# Patient Record
Sex: Male | Born: 1970 | Race: Black or African American | Hispanic: No | Marital: Married | Smoking: Never smoker
Health system: Southern US, Community
[De-identification: ages and names within clinical notes are randomized; demographics above are authoritative.]

## PROBLEM LIST (undated history)

## (undated) DIAGNOSIS — Z87442 Personal history of urinary calculi: Secondary | ICD-10-CM

## (undated) DIAGNOSIS — C61 Malignant neoplasm of prostate: Secondary | ICD-10-CM

## (undated) DIAGNOSIS — N189 Chronic kidney disease, unspecified: Secondary | ICD-10-CM

## (undated) HISTORY — PX: NO PAST SURGERIES: SHX2092

---

## 2003-09-28 ENCOUNTER — Emergency Department (HOSPITAL_COMMUNITY): Admission: EM | Admit: 2003-09-28 | Discharge: 2003-09-28 | Payer: Self-pay | Admitting: Emergency Medicine

## 2015-05-16 ENCOUNTER — Emergency Department (HOSPITAL_COMMUNITY)
Admission: EM | Admit: 2015-05-16 | Discharge: 2015-05-16 | Disposition: A | Discharge status: Home / Self Care | Payer: Managed Care, Other (non HMO) | Attending: Emergency Medicine | Admitting: Emergency Medicine

## 2015-05-16 ENCOUNTER — Emergency Department (HOSPITAL_COMMUNITY): Payer: Managed Care, Other (non HMO)

## 2015-05-16 ENCOUNTER — Encounter (HOSPITAL_COMMUNITY): Payer: Self-pay | Admitting: Emergency Medicine

## 2015-05-16 DIAGNOSIS — R109 Unspecified abdominal pain: Secondary | ICD-10-CM

## 2015-05-16 DIAGNOSIS — N200 Calculus of kidney: Secondary | ICD-10-CM

## 2015-05-16 DIAGNOSIS — R1031 Right lower quadrant pain: Secondary | ICD-10-CM | POA: Diagnosis present

## 2015-05-16 LAB — I-STAT CHEM 8, ED
BUN: 19 mg/dL (ref 6–20)
CHLORIDE: 104 mmol/L (ref 101–111)
Calcium, Ion: 1.21 mmol/L (ref 1.12–1.23)
Creatinine, Ser: 1.5 mg/dL — ABNORMAL HIGH (ref 0.61–1.24)
Glucose, Bld: 108 mg/dL — ABNORMAL HIGH (ref 65–99)
HEMATOCRIT: 46 % (ref 39.0–52.0)
Hemoglobin: 15.6 g/dL (ref 13.0–17.0)
POTASSIUM: 4 mmol/L (ref 3.5–5.1)
SODIUM: 141 mmol/L (ref 135–145)
TCO2: 22 mmol/L (ref 0–100)

## 2015-05-16 LAB — CBC WITH DIFFERENTIAL/PLATELET
BASOS PCT: 0 %
Basophils Absolute: 0 10*3/uL (ref 0.0–0.1)
EOS ABS: 0 10*3/uL (ref 0.0–0.7)
Eosinophils Relative: 0 %
HCT: 41.1 % (ref 39.0–52.0)
HEMOGLOBIN: 14.6 g/dL (ref 13.0–17.0)
LYMPHS ABS: 0.8 10*3/uL (ref 0.7–4.0)
Lymphocytes Relative: 9 %
MCH: 30.7 pg (ref 26.0–34.0)
MCHC: 35.5 g/dL (ref 30.0–36.0)
MCV: 86.5 fL (ref 78.0–100.0)
Monocytes Absolute: 0.4 10*3/uL (ref 0.1–1.0)
Monocytes Relative: 4 %
NEUTROS PCT: 87 %
Neutro Abs: 7.9 10*3/uL — ABNORMAL HIGH (ref 1.7–7.7)
Platelets: 171 10*3/uL (ref 150–400)
RBC: 4.75 MIL/uL (ref 4.22–5.81)
RDW: 13.3 % (ref 11.5–15.5)
WBC: 9.1 10*3/uL (ref 4.0–10.5)

## 2015-05-16 LAB — URINALYSIS, ROUTINE W REFLEX MICROSCOPIC
BILIRUBIN URINE: NEGATIVE
Glucose, UA: NEGATIVE mg/dL
Ketones, ur: NEGATIVE mg/dL
Leukocytes, UA: NEGATIVE
NITRITE: NEGATIVE
Protein, ur: NEGATIVE mg/dL
SPECIFIC GRAVITY, URINE: 1.023 (ref 1.005–1.030)
pH: 7 (ref 5.0–8.0)

## 2015-05-16 LAB — URINE MICROSCOPIC-ADD ON: Squamous Epithelial / LPF: NONE SEEN

## 2015-05-16 MED ORDER — KETOROLAC TROMETHAMINE 30 MG/ML IJ SOLN
30.0000 mg | Freq: Once | INTRAMUSCULAR | Status: AC
  Administered 2015-05-16: 30 mg via INTRAVENOUS
  Filled 2015-05-16: qty 1

## 2015-05-16 MED ORDER — IBUPROFEN 600 MG PO TABS: 600.0000 mg | ORAL_TABLET | Freq: Four times a day (QID) | ORAL | Status: DC | PRN

## 2015-05-16 MED ORDER — ONDANSETRON 8 MG PO TBDP: 8.0000 mg | ORAL_TABLET | Freq: Three times a day (TID) | ORAL | Status: DC | PRN

## 2015-05-16 MED ORDER — MORPHINE SULFATE (PF) 4 MG/ML IV SOLN
4.0000 mg | Freq: Once | INTRAVENOUS | Status: AC
  Administered 2015-05-16: 4 mg via INTRAVENOUS
  Filled 2015-05-16: qty 1

## 2015-05-16 MED ORDER — ONDANSETRON HCL 4 MG/2ML IJ SOLN
4.0000 mg | Freq: Once | INTRAMUSCULAR | Status: AC
  Administered 2015-05-16: 4 mg via INTRAVENOUS
  Filled 2015-05-16: qty 2

## 2015-05-16 MED ORDER — TAMSULOSIN HCL 0.4 MG PO CAPS: 0.4000 mg | ORAL_CAPSULE | Freq: Every day | ORAL | Status: DC

## 2015-05-16 MED ORDER — OXYCODONE-ACETAMINOPHEN 5-325 MG PO TABS: 1.0000 | ORAL_TABLET | ORAL | Status: DC | PRN

## 2015-05-16 NOTE — ED Notes (Signed)
Pt reports R flank for the past 2 days with radiation into groin with urination. Pt went to [facility] who completed an [place] which showed renal swelling. Pt waiting to hear from a urologist to schedule an appointment, but has not gotten return phone call yet. Pain has become worse so he came here for evaluation.

## 2015-05-16 NOTE — ED Provider Notes (Signed)
CSN: [id number]     Arrival date & time 05/16/15  1226 History   First MD Initiated Contact with Patient 05/16/15 1238     Chief Complaint  Patient presents with  . Flank Pain     (Consider location/radiation/quality/duration/timing/severity/associated sxs/prior Treatment) HPI Blake Maxwell is a 44 y.o. male with no medical problems, presents to emergency department complaining of right flank pain. Patient states severe pain in the right flank started 2 days ago. It radiates into the right groin. He reports similar pain one month ago which resolved and again one week ago. He seems to think that this severe pain is still the same pain from a week ago. He went to [facility] where he had an ultrasound which showed "swelling around the kidney. "He states that they told him they did not think this was a kidney stone and told him he needed to follow-up with urologist. Patient states they were supposed to set up urology follow-up but he has not heard from them. He states pain has been worsening, he reports nausea and vomiting this morning. He states pain comes and goes in severity. She reports blood in his urine. He denies any fever or chills. He denies any abdominal tenderness. No history of bladder infections. He is currently not taking any medications for his pain.  History reviewed. No pertinent past medical history. History reviewed. No pertinent past surgical history. History reviewed. No pertinent family history. Social History  Substance Use Topics  . Smoking status: Never Smoker   . Smokeless tobacco: None  . Alcohol Use: No    Review of Systems  Constitutional: Negative for fever and chills.  Respiratory: Negative for cough, chest tightness and shortness of breath.   Cardiovascular: Negative for chest pain, palpitations and leg swelling.  Gastrointestinal: Positive for nausea, vomiting and abdominal pain. Negative for diarrhea and abdominal distention.  Genitourinary:  Positive for dysuria, frequency, hematuria and flank pain. Negative for urgency.  Musculoskeletal: Negative for myalgias, arthralgias, neck pain and neck stiffness.  Skin: Negative for rash.  Allergic/Immunologic: Negative for immunocompromised state.  Neurological: Negative for dizziness, weakness, light-headedness, numbness and headaches.  All other systems reviewed and are negative.     Allergies  Review of patient's allergies indicates no known allergies.  Home Medications   Prior to Admission medications   Not on File   BP 128/83 mmHg  Pulse 98  Temp(Src) 98.2 F (36.8 C) (Oral)  SpO2 100% Physical Exam  Constitutional: He appears well-developed and well-nourished. No distress.  HENT:  Head: Normocephalic and atraumatic.  Eyes: Conjunctivae are normal.  Neck: Neck supple.  Cardiovascular: Normal rate, regular rhythm and normal heart sounds.   Pulmonary/Chest: Effort normal. No respiratory distress. He has no wheezes. He has no rales.  Abdominal: Soft. Bowel sounds are normal. He exhibits no distension. There is no tenderness. There is no rebound and no guarding.  Right CVA tenderness  Musculoskeletal: He exhibits no edema.  Neurological: He is alert.  Skin: Skin is warm and dry.  Nursing note and vitals reviewed.   ED Course  Procedures (including critical care time) Labs Review Labs Reviewed  URINALYSIS, ROUTINE W REFLEX MICROSCOPIC (NOT AT [facility]) - Abnormal; Notable for the following:    APPearance CLOUDY (*)    Hgb urine dipstick MODERATE (*)    All other components within normal limits  CBC WITH DIFFERENTIAL/PLATELET - Abnormal; Notable for the following:    Neutro Abs 7.9 (*)    All other components  within normal limits  URINE MICROSCOPIC-ADD ON - Abnormal; Notable for the following:    Bacteria, UA RARE (*)    All other components within normal limits  I-STAT CHEM 8, ED - Abnormal; Notable for the following:    Creatinine, Ser 1.50 (*)    Glucose, Bld  108 (*)    All other components within normal limits    Imaging Review Ct Renal Stone Study  05/16/2015  CLINICAL DATA:  Acute right flank pain.  Gross hematuria. EXAM: CT ABDOMEN AND PELVIS WITHOUT CONTRAST TECHNIQUE: Multidetector CT imaging of the abdomen and pelvis was performed following the standard protocol without IV contrast. COMPARISON:  None. FINDINGS: Visualized lung bases are unremarkable. No significant osseous abnormality is noted. No gallstones are noted. No focal abnormality is noted in the liver, spleen or pancreas on these unenhanced images. Adrenal glands appear normal. Small nonobstructive calculi are noted in lower pole collecting system of left kidney. Moderate right hydroureteronephrosis with perinephric stranding is noted secondary to 8 mm calculus in distal right ureter. Urinary bladder appears normal. Mild prostatic enlargement is noted. The appendix appears normal. There is no evidence of bowel obstruction. No abnormal fluid collection is noted. No significant adenopathy is noted. IMPRESSION: Nonobstructive left nephrolithiasis. Moderate right hydroureteronephrosis with perinephric stranding secondary to 8 mm distal right ureteral calculus. Electronically Signed   By: Marijo Conception, M.D.   On: 05/16/2015 14:25   I have personally reviewed and evaluated these images and lab results as part of my medical decision-making.   EKG Interpretation None      MDM   Final diagnoses:  Right flank pain  Kidney stone    Patient with a right flank pain radiating into the right groin with urinary symptoms, hematuria. No fever. Vital signs are normal. His symptoms are concerning for a kidney stone. Will get CT renal studies, urinalysis, CBC and chem 8. Toradol and morphine ordered for pain.  2:55 PM 11mm stone in distal ureter on the right. Pt's pain is improved with toradol and morphine. He has no nausea or vomiting. Afebrile. ua with no signs of infection. Creatinine at 1.5  Discussed with Dr. Diona Fanti, advised to call the office for close follow up apt. Pt agrees. Home with ibuprofen, percocet, zofran, flomax.  Filed Vitals:   05/16/15 1233  BP: 128/83  Pulse: 98  Temp: 98.2 F (36.8 C)  TempSrc: Oral  SpO2: 100%     Jeannett Senior, PA-C 05/16/15 [place], MD 05/19/15 [phone]

## 2015-05-16 NOTE — Discharge Instructions (Signed)
Ibuprofen for pain. Percocet for severe pain. zofran for nausea as needed. flomax to help you pass the stone. Follow up with urology as soon as able. Return if fever, uncontrolled pain, persistent vomiting.   Kidney Stones Kidney stones (urolithiasis) are deposits that form inside your kidneys. The intense pain is caused by the stone moving through the urinary tract. When the stone moves, the ureter goes into spasm around the stone. The stone is usually passed in the urine.  CAUSES   A disorder that makes certain neck glands produce too much parathyroid hormone (primary hyperparathyroidism).  A buildup of uric acid crystals, similar to gout in your joints.  Narrowing (stricture) of the ureter.  A kidney obstruction present at birth (congenital obstruction).  Previous surgery on the kidney or ureters.  Numerous kidney infections. SYMPTOMS   Feeling sick to your stomach (nauseous).  Throwing up (vomiting).  Blood in the urine (hematuria).  Pain that usually spreads (radiates) to the groin.  Frequency or urgency of urination. DIAGNOSIS   Taking a history and physical exam.  Blood or urine tests.  CT scan.  Occasionally, an examination of the inside of the urinary bladder (cystoscopy) is performed. TREATMENT   Observation.  Increasing your fluid intake.  Extracorporeal shock wave lithotripsy--This is a noninvasive procedure that uses shock waves to break up kidney stones.  Surgery may be needed if you have severe pain or persistent obstruction. There are various surgical procedures. Most of the procedures are performed with the use of small instruments. Only small incisions are needed to accommodate these instruments, so recovery time is minimized. The size, location, and chemical composition are all important variables that will determine the proper choice of action for you. Talk to your health care provider to better understand your situation so that you will minimize the  risk of injury to yourself and your kidney.  HOME CARE INSTRUCTIONS   Drink enough water and fluids to keep your urine clear or pale yellow. This will help you to pass the stone or stone fragments.  Strain all urine through the provided strainer. Keep all particulate matter and stones for your health care provider to see. The stone causing the pain may be as small as a grain of salt. It is very important to use the strainer each and every time you pass your urine. The collection of your stone will allow your health care provider to analyze it and verify that a stone has actually passed. The stone analysis will often identify what you can do to reduce the incidence of recurrences.  Only take over-the-counter or prescription medicines for pain, discomfort, or fever as directed by your health care provider.  Keep all follow-up visits as told by your health care provider. This is important.  Get follow-up X-rays if required. The absence of pain does not always mean that the stone has passed. It may have only stopped moving. If the urine remains completely obstructed, it can cause loss of kidney function or even complete destruction of the kidney. It is your responsibility to make sure X-rays and follow-ups are completed. Ultrasounds of the kidney can show blockages and the status of the kidney. Ultrasounds are not associated with any radiation and can be performed easily in a matter of minutes.  Make changes to your daily diet as told by your health care provider. You may be told to:  Limit the amount of salt that you eat.  Eat 5 or more servings of fruits and vegetables each  day.  Limit the amount of meat, poultry, fish, and eggs that you eat.  Collect a 24-hour urine sample as told by your health care provider.You may need to collect another urine sample every 6-12 months. SEEK MEDICAL CARE IF:  You experience pain that is progressive and unresponsive to any pain medicine you have been  prescribed. SEEK IMMEDIATE MEDICAL CARE IF:   Pain cannot be controlled with the prescribed medicine.  You have a fever or shaking chills.  The severity or intensity of pain increases over 18 hours and is not relieved by pain medicine.  You develop a new onset of abdominal pain.  You feel faint or pass out.  You are unable to urinate.   This information is not intended to replace advice given to you by your health care provider. Make sure you discuss any questions you have with your health care provider.   Document Released: 05/31/2005 Document Revised: 02/19/2015 Document Reviewed: 11/01/2012 Elsevier Interactive Patient Education 2016 [place] Guidelines to Help Prevent Kidney Stones Your risk of kidney stones can be decreased by adjusting the foods you eat. The most important thing you can do is drink enough fluid. You should drink enough fluid to keep your urine clear or pale yellow. The following guidelines provide specific information for the type of kidney stone you have had. GUIDELINES ACCORDING TO TYPE OF KIDNEY STONE Calcium Oxalate Kidney Stones  Reduce the amount of salt you eat. Foods that have a lot of salt cause your body to release excess calcium into your urine. The excess calcium can combine with a substance called oxalate to form kidney stones.  Reduce the amount of animal protein you eat if the amount you eat is excessive. Animal protein causes your body to release excess calcium into your urine. Ask your dietitian how much protein from animal sources you should be eating.  Avoid foods that are high in oxalates. If you take vitamins, they should have less than 500 mg of vitamin C. Your body turns vitamin C into oxalates. You do not need to avoid fruits and vegetables high in vitamin C. Calcium Phosphate Kidney Stones  Reduce the amount of salt you eat to help prevent the release of excess calcium into your urine.  Reduce the amount of animal  protein you eat if the amount you eat is excessive. Animal protein causes your body to release excess calcium into your urine. Ask your dietitian how much protein from animal sources you should be eating.  Get enough calcium from food or take a calcium supplement (ask your dietitian for recommendations). Food sources of calcium that do not increase your risk of kidney stones include:  Broccoli.  Dairy products, such as cheese and yogurt.  Pudding. Uric Acid Kidney Stones  Do not have more than 6 oz of animal protein per day. FOOD SOURCES Animal Protein Sources  Meat (all types).  Poultry.  Eggs.  Fish, seafood. Foods High in [employer] seasonings.  Soy sauce.  Teriyaki sauce.  Cured and processed meats.  Salted crackers and snack foods.  Fast food.  Canned soups and most canned foods. Foods High in Oxalates  Grains:  Amaranth.  Barley.  Grits.  Wheat germ.  Bran.  Buckwheat flour.  All bran cereals.  Pretzels.  Whole wheat bread.  Vegetables:  Beans (wax).  Beets and beet greens.  Collard greens.  Eggplant.  [place]ole.  Leeks.  Okra.  Parsley.  Rutabagas.  Spinach.  Swiss chard.  Tomato paste.  Fried potatoes.  Sweet potatoes.  Fruits:  Red currants.  Figs.  Kiwi.  Rhubarb.  Meat and Other Protein Sources:  Beans (dried).  Soy burgers and other soybean products.  Miso.  Nuts (peanuts, almonds, pecans, cashews, hazelnuts).  Nut butters.  Sesame seeds and tahini (paste made of sesame seeds).  Poppy seeds.  Beverages:  Chocolate drink mixes.  Soy milk.  Instant iced tea.  Juices made from high-oxalate fruits or vegetables.  Other:  Carob.  Chocolate.  Fruitcake.  Marmalades.   This information is not intended to replace advice given to you by your health care provider. Make sure you discuss any questions you have with your health care provider.   Document Released: 09/25/2010 Document  Revised: 06/05/2013 Document Reviewed: 04/27/2013 Elsevier Interactive Patient Education [employer].

## 2015-05-20 ENCOUNTER — Other Ambulatory Visit: Payer: Self-pay | Admitting: Urology

## 2015-05-22 ENCOUNTER — Encounter (HOSPITAL_COMMUNITY): Payer: Self-pay | Admitting: *Deleted

## 2015-05-27 ENCOUNTER — Ambulatory Visit (HOSPITAL_COMMUNITY): Payer: Managed Care, Other (non HMO)

## 2015-05-27 ENCOUNTER — Encounter (HOSPITAL_COMMUNITY): Payer: Self-pay | Admitting: *Deleted

## 2015-05-27 ENCOUNTER — Ambulatory Visit (HOSPITAL_COMMUNITY)
Admission: RE | Admit: 2015-05-27 | Discharge: 2015-05-27 | Disposition: A | Discharge status: Home / Self Care | Payer: Managed Care, Other (non HMO) | Source: Ambulatory Visit | Attending: Urology | Admitting: Urology

## 2015-05-27 ENCOUNTER — Ambulatory Visit (HOSPITAL_COMMUNITY): Payer: Managed Care, Other (non HMO) | Admitting: Certified Registered"

## 2015-05-27 ENCOUNTER — Encounter (HOSPITAL_COMMUNITY)
Admission: RE | Disposition: A | Discharge status: Home / Self Care | Payer: Self-pay | Source: Ambulatory Visit | Attending: Urology

## 2015-05-27 DIAGNOSIS — N201 Calculus of ureter: Secondary | ICD-10-CM

## 2015-05-27 DIAGNOSIS — N202 Calculus of kidney with calculus of ureter: Secondary | ICD-10-CM | POA: Diagnosis not present

## 2015-05-27 DIAGNOSIS — Z79899 Other long term (current) drug therapy: Secondary | ICD-10-CM | POA: Diagnosis not present

## 2015-05-27 DIAGNOSIS — Z791 Long term (current) use of non-steroidal anti-inflammatories (NSAID): Secondary | ICD-10-CM | POA: Insufficient documentation

## 2015-05-27 DIAGNOSIS — Z79891 Long term (current) use of opiate analgesic: Secondary | ICD-10-CM | POA: Diagnosis not present

## 2015-05-27 HISTORY — PX: HOLMIUM LASER APPLICATION: SHX5852

## 2015-05-27 HISTORY — PX: CYSTOSCOPY WITH RETROGRADE PYELOGRAM, URETEROSCOPY AND STENT PLACEMENT: SHX5789

## 2015-05-27 HISTORY — DX: Personal history of urinary calculi: Z87.442

## 2015-05-27 HISTORY — DX: Chronic kidney disease, unspecified: N18.9

## 2015-05-27 SURGERY — CYSTOURETEROSCOPY, WITH RETROGRADE PYELOGRAM AND STENT INSERTION
Anesthesia: General | Laterality: Right

## 2015-05-27 MED ORDER — LIDOCAINE HCL (CARDIAC) 20 MG/ML IV SOLN
INTRAVENOUS | Status: DC | PRN
  Administered 2015-05-27: 50 mg via INTRAVENOUS

## 2015-05-27 MED ORDER — FENTANYL CITRATE (PF) 100 MCG/2ML IJ SOLN
INTRAMUSCULAR | Status: DC | PRN
  Administered 2015-05-27 (×2): 50 ug via INTRAVENOUS

## 2015-05-27 MED ORDER — PROPOFOL 10 MG/ML IV BOLUS
INTRAVENOUS | Status: DC | PRN
  Administered 2015-05-27: 200 mg via INTRAVENOUS

## 2015-05-27 MED ORDER — SODIUM CHLORIDE 0.9 % IR SOLN
Status: DC | PRN
  Administered 2015-05-27: 4000 mL

## 2015-05-27 MED ORDER — KETOROLAC TROMETHAMINE 30 MG/ML IJ SOLN
INTRAMUSCULAR | Status: DC | PRN
  Administered 2015-05-27: 30 mg via INTRAVENOUS

## 2015-05-27 MED ORDER — OXYCODONE-ACETAMINOPHEN 5-325 MG PO TABS: 1.0000 | ORAL_TABLET | Freq: Four times a day (QID) | ORAL | Status: DC | PRN

## 2015-05-27 MED ORDER — MIDAZOLAM HCL 2 MG/2ML IJ SOLN
INTRAMUSCULAR | Status: AC
  Filled 2015-05-27: qty 2

## 2015-05-27 MED ORDER — 0.9 % SODIUM CHLORIDE (POUR BTL) OPTIME
TOPICAL | Status: DC | PRN
  Administered 2015-05-27: 1000 mL

## 2015-05-27 MED ORDER — PROPOFOL 10 MG/ML IV BOLUS
INTRAVENOUS | Status: AC
  Filled 2015-05-27: qty 20

## 2015-05-27 MED ORDER — KETOROLAC TROMETHAMINE 30 MG/ML IJ SOLN
INTRAMUSCULAR | Status: AC
  Filled 2015-05-27: qty 1

## 2015-05-27 MED ORDER — IOHEXOL 300 MG/ML  SOLN
INTRAMUSCULAR | Status: DC | PRN
  Administered 2015-05-27: 3 mL via INTRAVENOUS

## 2015-05-27 MED ORDER — CEFAZOLIN SODIUM-DEXTROSE 2-3 GM-% IV SOLR
2.0000 g | INTRAVENOUS | Status: AC
  Administered 2015-05-27: 2 g via INTRAVENOUS

## 2015-05-27 MED ORDER — CEFAZOLIN SODIUM-DEXTROSE 2-3 GM-% IV SOLR
INTRAVENOUS | Status: AC
  Filled 2015-05-27: qty 50

## 2015-05-27 MED ORDER — ONDANSETRON HCL 4 MG/2ML IJ SOLN
INTRAMUSCULAR | Status: AC
  Filled 2015-05-27: qty 2

## 2015-05-27 MED ORDER — ONDANSETRON HCL 4 MG/2ML IJ SOLN
INTRAMUSCULAR | Status: DC | PRN
  Administered 2015-05-27: 4 mg via INTRAVENOUS

## 2015-05-27 MED ORDER — LACTATED RINGERS IV SOLN
INTRAVENOUS | Status: DC | PRN
  Administered 2015-05-27: 13:00:00 via INTRAVENOUS

## 2015-05-27 MED ORDER — DEXAMETHASONE SODIUM PHOSPHATE 10 MG/ML IJ SOLN
INTRAMUSCULAR | Status: AC
  Filled 2015-05-27: qty 1

## 2015-05-27 MED ORDER — CEPHALEXIN 500 MG PO CAPS: 500.0000 mg | ORAL_CAPSULE | Freq: Every day | ORAL | Status: DC

## 2015-05-27 MED ORDER — LIDOCAINE HCL (CARDIAC) 20 MG/ML IV SOLN
INTRAVENOUS | Status: AC
Start: 2015-05-27 — End: 2015-05-27
  Filled 2015-05-27: qty 5

## 2015-05-27 MED ORDER — FENTANYL CITRATE (PF) 100 MCG/2ML IJ SOLN
25.0000 ug | INTRAMUSCULAR | Status: DC | PRN
  Administered 2015-05-27 (×2): 50 ug via INTRAVENOUS

## 2015-05-27 MED ORDER — MIDAZOLAM HCL 5 MG/5ML IJ SOLN
INTRAMUSCULAR | Status: DC | PRN
  Administered 2015-05-27: 2 mg via INTRAVENOUS

## 2015-05-27 MED ORDER — FENTANYL CITRATE (PF) 100 MCG/2ML IJ SOLN
INTRAMUSCULAR | Status: AC
  Filled 2015-05-27: qty 2

## 2015-05-27 MED ORDER — DEXAMETHASONE SODIUM PHOSPHATE 4 MG/ML IJ SOLN
INTRAMUSCULAR | Status: DC | PRN
  Administered 2015-05-27: 10 mg via INTRAVENOUS

## 2015-05-27 MED ORDER — FENTANYL CITRATE (PF) 250 MCG/5ML IJ SOLN
INTRAMUSCULAR | Status: AC
  Filled 2015-05-27: qty 5

## 2015-05-27 MED ORDER — PROMETHAZINE HCL 25 MG/ML IJ SOLN: 6.2500 mg | INTRAMUSCULAR | Status: DC | PRN

## 2015-05-27 SURGICAL SUPPLY — 23 items
BAG URO CATCHER STRL LF (DRAPE) ×3 IMPLANT
BASKET LASER NITINOL 1.9FR (BASKET) ×3 IMPLANT
BASKET STNLS GEMINI 4WIRE 3FR (BASKET) IMPLANT
BASKET ZERO TIP NITINOL 2.4FR (BASKET) IMPLANT
CATH INTERMIT  6FR 70CM (CATHETERS) IMPLANT
CATH URET DUAL LUMEN 6-10FR 50 (CATHETERS) ×3 IMPLANT
CLOTH BEACON ORANGE TIMEOUT ST (SAFETY) ×3 IMPLANT
FIBER LASER FLEXIVA 1000 (UROLOGICAL SUPPLIES) IMPLANT
FIBER LASER FLEXIVA 200 (UROLOGICAL SUPPLIES) ×3 IMPLANT
FIBER LASER FLEXIVA 365 (UROLOGICAL SUPPLIES) IMPLANT
FIBER LASER FLEXIVA 550 (UROLOGICAL SUPPLIES) IMPLANT
FIBER LASER TRAC TIP (UROLOGICAL SUPPLIES) IMPLANT
GLOVE BIOGEL M STRL SZ7.5 (GLOVE) ×3 IMPLANT
GOWN STRL REUS W/TWL XL LVL3 (GOWN DISPOSABLE) ×3 IMPLANT
GUIDEWIRE ANG ZIPWIRE 038X150 (WIRE) IMPLANT
GUIDEWIRE STR DUAL SENSOR (WIRE) ×3 IMPLANT
IV NS IRRIG 3000ML ARTHROMATIC (IV SOLUTION) ×6 IMPLANT
PACK CYSTO (CUSTOM PROCEDURE TRAY) ×3 IMPLANT
SHEATH ACCESS URETERAL 24CM (SHEATH) IMPLANT
SHEATH ACCESS URETERAL 38CM (SHEATH) ×3 IMPLANT
SHEATH ACCESS URETERAL 54CM (SHEATH) IMPLANT
STENT CONTOUR 6FRX26X.038 (STENTS) ×3 IMPLANT
SYRINGE IRR TOOMEY STRL 70CC (SYRINGE) IMPLANT

## 2015-05-27 NOTE — Interval H&P Note (Signed)
History and Physical Interval Note:  05/27/2015 12:44 PM  Blake Maxwell  has presented today for surgery, with the diagnosis of RIGHT URETERAL STONE  The various methods of treatment have been discussed with the patient and family. After consideration of risks, benefits and other options for treatment, the patient has consented to  Procedure(s): CYSTOSCOPY WITH RIGHT  RETROGRADE PYELOGRAM, URETEROSCOPY HOLMIUM LASER LITHOTRIPSY AND STENT PLACEMENT (Right) HOLMIUM LASER APPLICATION (Right) as a surgical intervention .  The patient's history has been reviewed, patient examined, no change in status, stable for surgery. KUB done today was again equivocal. I discussed with the patient and may have passed the stone but he reports he has been straining his urine and not seen it pass. He also reports some mild right flank discomfort. Nothing as severe as when he initially presented. I discussed with him the nature risk and benefits of continued surveillance, follow-up in office further imaging, cystoscopy, right retrograde, right ureteroscopy, laser lithotripsy and stent. I did note the Hounsfield units on the stone were around 600-800 and it might be difficult to see on KUB. All questions answered. He elects to proceed with definitive evaluation with right retrograde and ureteroscopy. We also discussed the small stones in the left lower pole and the nature risk and benefits of surveillance vs ureteroscopy to approach these with possible need for staged procedure. He elected to continue surveillance on the left stones.  I have reviewed the patient's chart and labs.  Questions were answered to the patient's satisfaction.     Geralynn Capri

## 2015-05-27 NOTE — Op Note (Signed)
Preoperative diagnosis: Right distal ureteral stone Postoperative diagnosis: Same  Procedure: Cystoscopy, right retrograde pyelogram, right ureteroscopy with holmium laser lithotripsy, stone basket extraction and right ureteral stent placement  Surgeon: Junious Silk  Anesthesia: Gen.  Indication for procedure: 44 year old with symptomatic right distal ureteral stone tone was not seen stone pass. Stone not visible on KUB.  Findings: On cystoscopy the urethra and the bladder were unremarkable. There were no stones or foreign bodies in the bladder.  Right retrograde pyelogram-this outlined a single ureter single collecting system unit with filling defect in the right distal ureter consistent with the stone and mild dilation of the ureter collecting system proximally.  Right ureteroscopy-stone was located in the right distal ureter and was quite long. It was too large to go through the intramural ureter which was quite tight.  Description of procedure: After consent was obtained patient brought to the operating room. After adequate anesthesia he was placed in lithotomy position and prepped and draped in the usual sterile fashion. A timeout was performed to confirm the patient and procedure. The cystoscope was passed per urethra and the right ureteral orifice was cannulated with a 6 [place] open-ended catheter and gentle retrograde injection of contrast was performed. A sensor wire was then advanced and coiled in the collecting system. I then tried to pass the semirigid ureteroscope but it would not pass through a tight UO and intramural ureter. I passed a dual-lumen exchange catheter to help dilate the ureter but still could not pass the double-channel semirigid. Therefore I got the 4.5 [place] single-channel ureteroscope and and now passed without difficulty. The stone was encountered and I got it in an escape basket but was too large to pole through that tight intramural ureter. Therefore I dropped it back  in the distal ureter which was moderately dilated. The stone was then broken up at a setting of 0.2 and 50 into small pieces which were then sequentially grasped and dropped into the bladder. I was unable to scope all the way up into the proximal ureter without difficulty and no other stones or stone fragments were noted. The ureter was normal. The cystoscope was passed adjacent to the wire and the bladder and stone fragments were drained and the cystoscope removed. The wire was backloaded on the scope and a 6 x 26 cm stent was advanced. The wire was removed with a good coil in the kidney and a good coil in the bladder seen. The bladder was drained and the scope removed. I left a string on the stent. The patient was awakened and taken to the recovery room in stable condition.  Complications: None  Blood loss: Minimal  Specimens: Stone fragments to office lab  Drains: 6 x 26 cm right ureteral stent with string  Disposition: Patient stable to PACU.

## 2015-05-27 NOTE — Anesthesia Postprocedure Evaluation (Signed)
Anesthesia Post Note  Patient: Blake Maxwell  Procedure(s) Performed: Procedure(s) (LRB): CYSTOSCOPY WITH RIGHT  RETROGRADE PYELOGRAM, URETEROSCOPY AND STENT PLACEMENT (Right) HOLMIUM LASER APPLICATION (Right)  Patient location during evaluation: PACU Anesthesia Type: General Level of consciousness: awake and alert Pain management: pain level controlled Vital Signs Assessment: post-procedure vital signs reviewed and stable Respiratory status: spontaneous breathing, nonlabored ventilation, respiratory function stable and patient connected to nasal cannula oxygen Cardiovascular status: blood pressure returned to baseline and stable Postop Assessment: no signs of nausea or vomiting Anesthetic complications: no    Last Vitals:  Filed Vitals:   05/27/15 1516 05/27/15 1600  BP: 124/80 128/81  Pulse: 56 63  Temp: 36.8 C   Resp: 16 16    Last Pain:  Filed Vitals:   05/27/15 1621  PainSc: 0-No pain                 [name] J

## 2015-05-27 NOTE — Discharge Instructions (Signed)
Ureteral Stent Implantation, Care After Refer to this sheet in the next few weeks. These instructions provide you with information on caring for yourself after your procedure. Your health care provider may also give you more specific instructions. Your treatment has been planned according to current medical practices, but problems sometimes occur. Call your health care provider if you have any problems or questions after your procedure.  REMOVAL OF THE STENT: Remove the stent by pulling the string with slow steady pressure on Monday morning, 06/02/2015 until the entire stent is removed.  WHAT TO EXPECT AFTER THE PROCEDURE You should be back to normal activity within 48 hours after the procedure. Nausea and vomiting may occur and are commonly the result of anesthesia. It is common to experience sharp pain in the back or lower abdomen and penis with voiding. This is caused by movement of the ends of the stent with the act of urinating.It usually goes away within minutes after you have stopped urinating.  HOME CARE INSTRUCTIONS Make sure to drink plenty of fluids. You may have small amounts of bleeding, causing your urine to be red. This is normal. Certain movements may trigger pain or a feeling that you need to urinate. You may be given medicines to prevent infection or bladder spasms. Be sure to take all medicines as directed. Only take over-the-counter or prescription medicines for pain, discomfort, or fever as directed by your health care provider.   Be sure to keep all follow-up appointments so your health care provider can check that you are healing properly.  SEEK MEDICAL CARE IF:  You experience increasing pain.  Your pain medicine is not working. SEEK IMMEDIATE MEDICAL CARE IF:  Your urine is dark red or has blood clots.  You are leaking urine (incontinent).  You have a fever, chills, feeling sick to your stomach (nausea), or vomiting.  Your pain is not relieved by pain  medicine.  The end of the stent comes out of the urethra.  You are unable to urinate.   This information is not intended to replace advice given to you by your health care provider. Make sure you discuss any questions you have with your health care provider.   Document Released: 01/31/2013 Document Revised: 06/05/2013 Document Reviewed: 12/13/2014 Elsevier Interactive Patient Education [employer].

## 2015-05-27 NOTE — Anesthesia Preprocedure Evaluation (Signed)
Anesthesia Evaluation  Patient identified by MRN, date of birth, ID band Patient awake    Reviewed: Allergy & Precautions, NPO status , Patient's Chart, lab work & pertinent test results  Airway Mallampati: II  TM Distance: >3 FB Neck ROM: Full    Dental no notable dental hx.    Pulmonary neg pulmonary ROS,    Pulmonary exam normal breath sounds clear to auscultation       Cardiovascular negative cardio ROS Normal cardiovascular exam Rhythm:Regular Rate:Normal     Neuro/Psych negative neurological ROS  negative psych ROS   GI/Hepatic negative GI ROS, Neg liver ROS,   Endo/Other  negative endocrine ROS  Renal/GU Renal disease  negative genitourinary   Musculoskeletal negative musculoskeletal ROS (+)   Abdominal   Peds negative pediatric ROS (+)  Hematology negative hematology ROS (+)   Anesthesia Other Findings   Reproductive/Obstetrics negative OB ROS                             Anesthesia Physical Anesthesia Plan  ASA: II  Anesthesia Plan: General   Post-op Pain Management:    Induction: Intravenous  Airway Management Planned: LMA  Additional Equipment:   Intra-op Plan:   Post-operative Plan: Extubation in OR  Informed Consent: I have reviewed the patients History and Physical, chart, labs and discussed the procedure including the risks, benefits and alternatives for the proposed anesthesia with the patient or authorized representative who has indicated his/her understanding and acceptance.   Dental advisory given  Plan Discussed with: CRNA  Anesthesia Plan Comments:         Anesthesia Quick Evaluation

## 2015-05-27 NOTE — H&P (Signed)
History of Present Illness Consultation for right ureteral stone referred by Dr. Maryan Rued. For a few weeks patient has had some right flank pain and right lower quadrant pain. He went to the emergency department December 2 where CT scan of the abdomen and pelvis was done which revealed a 5 x 8 mm right distal ureteral stone with moderate right hydroureteronephrosis. There was also a small left kidney stone. He has been tolerating a regular diet and drinking plenty of fluids. His UA showed some rare bacteria but it is clear today. His white count was 9 with a BUN of 19 and creatinine 1.5. He has no prior history of kidney stones.    He has not seen a stone passed and continues to have some right low back and right lower quadrant pain. He continues tamsulosin.    KUB comparison to prior CT, findings: Right distal stone visible in the right sacrum in this area on the CT. Left lower pole stones are noted.   Past Medical History Problems  1. No pertinent past medical history  Surgical History Problems  1. History of No Surgical Problems  Current Meds 1. Ibuprofen 600 MG Oral Tablet;  Therapy: (Recorded:05Dec2016) to Recorded 2. Oxycodone-Acetaminophen TABS;  Therapy: (Recorded:05Dec2016) to Recorded 3. Tamsulosin HCl - 0.4 MG Oral Capsule;  Therapy: (Recorded:05Dec2016) to Recorded 4. Zofran TABS;  Therapy: (Recorded:05Dec2016) to Recorded  Allergies Medication  1. No Known Drug Allergies  Family History Problems  1. No pertinent family history : Mother, Father  Social History Problems    Denied: History of Alcohol use   Denied: History of Caffeine use   Married   Never a smoker   Number of children   Occupation  Review of Systems Genitourinary, constitutional, skin, eye, otolaryngeal, hematologic/lymphatic, cardiovascular, pulmonary, endocrine, musculoskeletal, gastrointestinal, neurological and psychiatric system(s) were reviewed and pertinent findings if present are  noted and are otherwise negative.  Gastrointestinal: nausea and vomiting.  Musculoskeletal: back pain.    Vitals Vital Signs [Data Includes: Last 1 Day]  Recorded: [id number] 10:37AM  Height: 5 ft 9 in Weight: 205 lb  BMI Calculated: 30.27 BSA Calculated: 2.09 Blood Pressure: 144 / 83 Temperature: 98 F Heart Rate: 64  Physical Exam Constitutional: Well nourished and well developed . No acute distress.  Pulmonary: No respiratory distress and normal respiratory rhythm and effort.  Cardiovascular: Heart rate and rhythm are normal . No peripheral edema.  Neuro/Psych:. Mood and affect are appropriate.    Results/Data Urine [Data Includes: Last 1 Day]   [id number]  COLOR YELLOW   APPEARANCE CLEAR   SPECIFIC GRAVITY 1.010   pH 6.0   GLUCOSE NEGATIVE   BILIRUBIN NEGATIVE   KETONE NEGATIVE   BLOOD TRACE   PROTEIN NEGATIVE   NITRITE NEGATIVE   LEUKOCYTE ESTERASE NEGATIVE   SQUAMOUS EPITHELIAL/HPF 0-5 HPF  WBC 0-5 WBC/HPF  RBC 0-2 RBC/HPF  BACTERIA NONE SEEN HPF  CRYSTALS NONE SEEN HPF  CASTS See Below LPF  Yeast NONE SEEN HPF   Procedure KUB-comparison to prior CT, findings possible stone over the right sacrum. Stable pelvic phleboliths. Small left lower pole stones. The bones and the bowel gas pattern appeared normal.     Assessment Assessed  1. Nephrolithiasis (N20.0) 2. Right ureteral stone (N20.1)  Plan Health Maintenance  1. UA With REFLEX; [Do Not Release]; Status:Complete;   [id number] 10:20AM Right ureteral stone  2. Follow-up Schedule Surgery Office  Follow-up  Status: Hold For - Appointment   Requested for: [id number] 3. KUB;  Status:Complete;   DoneEY:[id number] 11:02AM  Discussion/Summary Right ureteral stone-KUB today equivocal but I do believe the stone is over the right sacrum which is where was on the CT. Coupled with the patient's symptoms I don't believe these passed and yet we discussed with the patient the nature and risks benefits and  alternatives to continued stone passage with medical expulsion therapy, ureteroscopy or shockwave lithotripsy. All questions answered. He would like to go ahead and set something up so we will set up a right ureteroscopy. We did discuss possibility of needing a staged procedure and also postoperative stent placement. Discussed when to seek medical attention/call office-on call.      Signatures Electronically signed by : Festus Aloe, M.D.; May 19 2015 11:35AM EST

## 2015-05-27 NOTE — Transfer of Care (Signed)
Immediate Anesthesia Transfer of Care Note  Patient: Blake Maxwell  Procedure(s) Performed: Procedure(s): CYSTOSCOPY WITH RIGHT  RETROGRADE PYELOGRAM, URETEROSCOPY AND STENT PLACEMENT (Right) HOLMIUM LASER APPLICATION (Right)  Patient Location: PACU  Anesthesia Type:General  Level of Consciousness: alert   Airway & Oxygen Therapy: Patient connected to face mask oxygen  Post-op Assessment: Post -op Vital signs reviewed and stable  Post vital signs: stable  Last Vitals:  Filed Vitals:   05/27/15 1027 05/27/15 1423  BP: 128/66 120/66  Pulse: 67   Temp: 36.5 C   Resp: 18     Complications: No apparent anesthesia complications

## 2015-05-27 NOTE — Anesthesia Procedure Notes (Signed)
Date/Time: 05/27/2015 1:24 PM Performed by: Pilar Grammes Pre-anesthesia Checklist: Patient identified Patient Re-evaluated:Patient Re-evaluated prior to inductionOxygen Delivery Method: Circle system utilized Preoxygenation: Pre-oxygenation with 100% oxygen Intubation Type: IV induction Ventilation: Mask ventilation without difficulty LMA: LMA inserted and LMA with gastric port inserted LMA Size: 5.0 Number of attempts: 1 Placement Confirmation: positive ETCO2,  CO2 detector and breath sounds checked- equal and bilateral

## 2015-05-28 ENCOUNTER — Encounter (HOSPITAL_COMMUNITY): Payer: Self-pay | Admitting: Urology

## 2018-04-03 ENCOUNTER — Other Ambulatory Visit: Payer: Self-pay | Admitting: Urology

## 2018-04-03 DIAGNOSIS — C61 Malignant neoplasm of prostate: Secondary | ICD-10-CM

## 2018-04-17 ENCOUNTER — Encounter (HOSPITAL_COMMUNITY)
Admission: RE | Admit: 2018-04-17 | Discharge: 2018-04-17 | Disposition: A | Discharge status: Home / Self Care | Payer: Managed Care, Other (non HMO) | Source: Ambulatory Visit | Attending: Urology | Admitting: Urology

## 2018-04-17 DIAGNOSIS — C61 Malignant neoplasm of prostate: Secondary | ICD-10-CM | POA: Diagnosis not present

## 2018-04-17 MED ORDER — TECHNETIUM TC 99M MEDRONATE IV KIT
20.0000 | PACK | Freq: Once | INTRAVENOUS | Status: AC | PRN
  Administered 2018-04-17: 21.8 via INTRAVENOUS

## 2018-05-01 ENCOUNTER — Encounter: Payer: Self-pay | Admitting: Medical Oncology

## 2018-05-01 NOTE — Progress Notes (Signed)
I called pt to introduce myself as the Prostate Nurse Navigator and the Coordinator of the Prostate [place].  1. I confirmed with the patient he is aware of his referral to the clinic 11/26 arriving at 12:30 pm.   2. I discussed the format of the clinic and the physicians he will be seeing that day.  3. I discussed where the clinic is located and how to contact me.  4. I confirmed his address and informed him I would be mailing a packet of information and forms to be completed. I asked him to bring them with him the day of his appointment.   He voiced understanding of the above. I asked him to call me if he has any questions or concerns regarding his appointments or the forms he needs to complete.

## 2018-05-04 ENCOUNTER — Encounter: Payer: Self-pay | Admitting: Medical Oncology

## 2018-05-08 ENCOUNTER — Telehealth: Payer: Self-pay | Admitting: Medical Oncology

## 2018-05-08 NOTE — Telephone Encounter (Signed)
Spoke with patient to confirm appointment for [facility] 11/26 arriving at 12:30 pm. I reviewed location, registration and reminded him to bring completed medical forms and to have lunch before arrival. He voiced understanding.

## 2018-05-09 ENCOUNTER — Encounter: Payer: Self-pay | Admitting: Radiation Oncology

## 2018-05-09 ENCOUNTER — Other Ambulatory Visit: Payer: Self-pay

## 2018-05-09 ENCOUNTER — Encounter: Payer: Self-pay | Admitting: General Practice

## 2018-05-09 ENCOUNTER — Inpatient Hospital Stay: Payer: Managed Care, Other (non HMO) | Attending: Oncology | Admitting: Oncology

## 2018-05-09 ENCOUNTER — Ambulatory Visit
Admission: RE | Admit: 2018-05-09 | Discharge: 2018-05-09 | Disposition: A | Discharge status: Home / Self Care | Payer: Managed Care, Other (non HMO) | Source: Ambulatory Visit | Attending: Radiation Oncology | Admitting: Radiation Oncology

## 2018-05-09 ENCOUNTER — Encounter: Payer: Self-pay | Admitting: Medical Oncology

## 2018-05-09 DIAGNOSIS — C61 Malignant neoplasm of prostate: Secondary | ICD-10-CM | POA: Diagnosis present

## 2018-05-09 DIAGNOSIS — Z803 Family history of malignant neoplasm of breast: Secondary | ICD-10-CM

## 2018-05-09 DIAGNOSIS — Z8052 Family history of malignant neoplasm of bladder: Secondary | ICD-10-CM | POA: Diagnosis not present

## 2018-05-09 DIAGNOSIS — Z809 Family history of malignant neoplasm, unspecified: Secondary | ICD-10-CM

## 2018-05-09 DIAGNOSIS — Z806 Family history of leukemia: Secondary | ICD-10-CM

## 2018-05-09 HISTORY — DX: Malignant neoplasm of prostate: C61

## 2018-05-09 NOTE — Progress Notes (Signed)
[facility] Psychosocial Distress Screening Spiritual Care  Met with Blake Maxwell and wife [name] in [facility] to introduce [facility] team/resources, reviewing distress screen per protocol.  The patient scored a 5 on the Psychosocial Distress Thermometer which indicates moderate distress. Also assessed for distress and other psychosocial needs.   ONCBCN DISTRESS SCREENING 05/09/2018  Screening Type Initial Screening  Distress experienced in past week (1-10) 5  Referral to support programs Yes   Graciano's facial affect appeared withdrawn and overwhelmed during this encounter. Spouse Lavona engaged in more conversation. They have two children, ages 79 and 50. Pt reports good support from family on both sides.   Provided pastoral presence, reflective listening, normalization of feelings, and emotional support. Encouraged Prostate Cancer Support Group for information, connection, and encouragement for both pt and partner. Living in [place] and parenting young children are barriers to participating in programs in [place], but couple liked the idea of "double dipping"--looking for events they could attend on days when they will already be in town.   Follow up needed: No. Per couple, no other needs at this time, but they are aware of ongoing Team availability as needed/desired. Please also page if immediate needs arise or circumstances change. Thank you.   [place], [place], [facility] Pager [phone] Voicemail [phone]

## 2018-05-09 NOTE — Progress Notes (Signed)
Reason for the request: Prostate cancer  HPI: I was asked by Dr. Junious Silk to evaluate Blake Maxwell for prostate cancer.  He is a 47 year old man currently of [place] where he lived majority of his life.  He is a rather healthy man who was found to have an elevated PSA on routine screening with a PSA of 56.  He was evaluated by Dr. Junious Silk and underwent a biopsy completed on 03/28/2018.  The biopsy showed a Gleason score 4+5 = 9 prostate cancer in one core and 11 other cores that showed a Gleason score of 4+3 equal 7.  All in all 12 out of 12 cores involved with cancer.  He is asymptomatic from these findings and denies any urinary symptoms.  He denies any frequency or hematuria.  He denies any decline in his energy or performance status.  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  He does not report any headaches, blurry vision, syncope or seizures. Does not report any fevers, chills or sweats.  Does not report any cough, wheezing or hemoptysis.  Does not report any chest pain, palpitation, orthopnea or leg edema.  Does not report any nausea, vomiting or abdominal pain.  Does not report any constipation or diarrhea.  Does not report any skeletal complaints.    Does not report frequency, urgency or hematuria.  Does not report any skin rashes or lesions. Does not report any heat or cold intolerance.  Does not report any lymphadenopathy or petechiae.  Does not report any anxiety or depression.  Remaining review of systems is negative.    Past Medical History:  Diagnosis Date  . Chronic kidney disease   . History of kidney stones   . Prostate cancer [facility])   :  Past Surgical History:  Procedure Laterality Date  . CYSTOSCOPY WITH RETROGRADE PYELOGRAM, URETEROSCOPY AND STENT PLACEMENT Right 05/27/2015   Procedure: CYSTOSCOPY WITH RIGHT  RETROGRADE PYELOGRAM, URETEROSCOPY AND STENT PLACEMENT;  Surgeon: Festus Aloe, MD;  Location: [facility];  Service: Urology;  Laterality: Right;  .  HOLMIUM LASER APPLICATION Right 02/58/5277   Procedure: HOLMIUM LASER APPLICATION;  Surgeon: Festus Aloe, MD;  Location: [facility];  Service: Urology;  Laterality: Right;  . NO PAST SURGERIES    :   Current Outpatient Medications:  Marland Kitchen  Multiple Vitamins-Minerals (MULTIVITAMIN ADULT PO), Take by mouth., Disp: , Rfl:  .  tamsulosin (FLOMAX) 0.4 MG CAPS capsule, Take 1 capsule (0.4 mg total) by mouth daily. (Patient not taking: Reported on 05/09/2018), Disp: 10 capsule, Rfl: 0:  No Known Allergies:  Family History  Problem Relation Age of Onset  . Leukemia Maternal Aunt   . Bladder Cancer Maternal Uncle   . Cancer Maternal Uncle   . Cancer Maternal Grandmother   . Breast cancer Maternal Grandmother   . Prostate cancer Neg Hx   :  Social History   Socioeconomic History  . Marital status: Married    Spouse name: Blake Maxwell  . Number of children: 2  . Years of education: Not on file  . Highest education level: Not on file  Occupational History  . Not on file  Social Needs  . Financial resource strain: Not on file  . Food insecurity:    Worry: Not on file    Inability: Not on file  . Transportation needs:    Medical: Not on file    Non-medical: Not on file  Tobacco Use  . Smoking status: Never Smoker  . Smokeless tobacco: Never Used  Substance and Sexual Activity  . Alcohol use: No  . Drug use: No  . Sexual activity: Yes  Lifestyle  . Physical activity:    Days per week: Not on file    Minutes per session: Not on file  . Stress: Not on file  Relationships  . Social connections:    Talks on phone: Not on file    Gets together: Not on file    Attends religious service: Not on file    Active member of club or organization: Not on file    Attends meetings of clubs or organizations: Not on file    Relationship status: Not on file  . Intimate partner violence:    Fear of current or ex partner: Not on file    Emotionally abused: Not on file    Physically abused: Not on  file    Forced sexual activity: Not on file  Other Topics Concern  . Not on file  Social History Narrative  . Not on file  :  Pertinent items are noted in HPI.  Exam: ECOG 0 General appearance: alert and cooperative appeared without distress. Head: atraumatic without any abnormalities. Eyes: conjunctivae/corneas clear. PERRL.  Sclera anicteric. Throat: lips, mucosa, and tongue normal; without oral thrush or ulcers. Resp: clear to auscultation bilaterally without rhonchi, wheezes or dullness to percussion. Cardio: regular rate and rhythm, S1, S2 normal, no murmur, click, rub or gallop GI: soft, non-tender; bowel sounds normal; no masses,  no organomegaly Skin: Skin color, texture, turgor normal. No rashes or lesions Lymph nodes: Cervical, supraclavicular, and axillary nodes normal. Neurologic: Grossly normal without any motor, sensory or deep tendon reflexes. Musculoskeletal: No joint deformity or effusion.  CBC    Component Value Date/Time   WBC 9.1 05/16/2015 1310   RBC 4.75 05/16/2015 1310   HGB 15.6 05/16/2015 1319   HCT 46.0 05/16/2015 1319   PLT 171 05/16/2015 1310   MCV 86.5 05/16/2015 1310   MCH 30.7 05/16/2015 1310   MCHC 35.5 05/16/2015 1310   RDW 13.3 05/16/2015 1310   LYMPHSABS 0.8 05/16/2015 1310   MONOABS 0.4 05/16/2015 1310   EOSABS 0.0 05/16/2015 1310   BASOSABS 0.0 05/16/2015 1310     Chemistry      Component Value Date/Time   NA 141 05/16/2015 1319   K 4.0 05/16/2015 1319   CL 104 05/16/2015 1319   BUN 19 05/16/2015 1319   CREATININE 1.50 (H) 05/16/2015 1319   No results found for: CALCIUM, ALKPHOS, AST, ALT, BILITOT     Nm Bone Scan Whole Body  Result Date: 04/17/2018 CLINICAL DATA:  Recent diagnosis of prostate cancer. EXAM: NUCLEAR MEDICINE WHOLE BODY BONE SCAN TECHNIQUE: Whole body anterior and posterior images were obtained approximately 3 hours after intravenous injection of radiopharmaceutical. RADIOPHARMACEUTICALS:  21.8 mCi  Technetium-41m MDP IV COMPARISON:  CT scan 04/17/2018 FINDINGS: Areas of moderate degenerative type uptake are noted in the shoulders, sternoclavicular joints, hips and knees. No findings suspicious for osseous metastatic disease. IMPRESSION: Areas of degenerative type uptake but no findings suspicious for osseous metastatic disease. Electronically Signed   By: Marijo Sanes M.D.   On: 04/17/2018 16:17    Assessment and Plan:   47 year old man with prostate cancer diagnosed in October 2019.  Presented with a Gleason score 4+5 = 9 with high volume high risk disease.  His PSA was 56.  His case was discussed today in the prostate cancer multidisciplinary clinic including review of his imaging studies with radiology and discussion  of his pathology with the reviewing pathologist.  He appears to have disease confined high risk high-volume disease.  The natural course of this disease was reviewed today with the patient and his wife in detail.  Treatment options were also discussed.  Curative options at this time would include primary surgical therapy versus radiation with long-term androgen deprivation.  Risks and benefits of both approaches were reviewed today and potential complication with androgen deprivation were reviewed today.  These include hot flashes, weight gain, sexual dysfunction among others.  The rationale for fibrosis approaches were reviewed today in detail including risks and benefits.  We also discussed the role of enrollment in a clinical trial that utilizes androgen deprivation therapy plus placebo or androgen receptor antagonist.  The role of neoadjuvant androgen deprivation in the surgical setting was discussed today in addition to the logistics of this clinical trial enrollment.  He will evaluate his options and consider treatment in the near future.  All his questions were answered to his satisfaction.  45  minutes was spent with the patient face-to-face today.  More than 50% of time was  dedicated to discussing the natural course of his disease, treatment options, complications related different modalities of therapy.    Thank you for the referral. A copy of this consult has been forwarded to the requesting physician.

## 2018-05-09 NOTE — Progress Notes (Signed)
                               Care Plan Summary  Name: Mr. Blake Maxwell DOB: May 25, 1971   Your Medical Team:   Urologist -  Dr. Raynelle Bring, [facility]  Radiation Oncologist - Dr. Tyler Pita, [facility]   Medical Oncologist - Dr. Zola Button, [place]  Recommendations: 1) Clinical trial (Proteus Trial)  2) Robotic prostatectomy  3) Radiation  * These recommendations are based on information available as of today's consult.      Recommendations may change depending on the results of further tests or exams.  Next Steps: 1) Consider your options and call Blake Rue, RN with treatment decision     When appointments need to be scheduled, you will be contacted by [facility] and/or [facility].  Patient provided with business cards for all team members and a copy of "Fall Prevention Patient Safety Sheet".  Questions?  Please do not hesitate to call Blake Rue, RN, BSN, OCN at [phone] any questions or concerns.  Blake Maxwell is your Oncology Nurse Navigator and is available to assist you while you're receiving your medical care at [facility].

## 2018-05-09 NOTE — Progress Notes (Signed)
Radiation Oncology         [phone] ________________________________  Multidisciplinary Prostate Cancer Clinic  Initial Radiation Oncology Consultation  Name: Blake ORTEZ MRN: [id number]  Date: 05/09/2018  DOB: 06-13-1971  QQ:VZDGLOV, Sonia Side, NP  Armanda Heritage, NP   REFERRING PHYSICIAN: Armanda Heritage, NP  DIAGNOSIS: 47 y.o. 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    ICD-10-CM   1. Malignant neoplasm of prostate ([place]) C61     HISTORY OF PRESENT ILLNESS::Blake Maxwell is a 47 y.o. gentleman who is an established patient of Dr. Junious Silk, previously followed for nephrolithasis.  He was noted to have an elevated PSA of 64 on 03/13/18, with his PCP, Emelia Loron, NP.  He was referred for further evaluation in urology with Dr. Junious Silk on 03/23/18, digital rectal examination was performed at that time revealing a 10 mm left apical nodule.  PSA was repeated at that time and remained elevated at 56.  Therefore, the patient proceeded to transrectal ultrasound with 12 biopsies of the prostate on 03/28/18.  The prostate volume measured 43 cc.  Out of 12 core biopsies, all 12 were positive.  The maximum Gleason score was 4+5, and this was seen in the left base lateral.  Additionally, Gleason 4+3 was noted in the left base, right base lateral, left and right mid gland, left and right apex and right apex lateral as well as Gleason 3+4 in the right base, left mid lateral, right mid lateral and left apex lateral.   In light of his high risk, high-volume disease, he proceeded with CT and bone scan imaging for disease staging.  CT A/P was performed on 04/17/2018 and was negative for any evidence of metastatic disease in the abdomen or pelvis.  A bone scan on 04/17/2018 showed degenerative changes only and no evidence of osseous metastatic disease.  The patient reviewed the biopsy results with his urologist and he has kindly been referred today  to the multidisciplinary prostate cancer clinic for presentation of pathology and radiology studies in our conference for discussion of potential radiation treatment options and clinical evaluation.  PREVIOUS RADIATION THERAPY: No  PAST MEDICAL HISTORY:  has a past medical history of Chronic kidney disease, History of kidney stones, and Prostate cancer ([place]).    PAST SURGICAL HISTORY: Past Surgical History:  Procedure Laterality Date  . CYSTOSCOPY WITH RETROGRADE PYELOGRAM, URETEROSCOPY AND STENT PLACEMENT Right 05/27/2015   Procedure: CYSTOSCOPY WITH RIGHT  RETROGRADE PYELOGRAM, URETEROSCOPY AND STENT PLACEMENT;  Surgeon: Festus Aloe, MD;  Location: [facility];  Service: Urology;  Laterality: Right;  . HOLMIUM LASER APPLICATION Right 56/43/3295   Procedure: HOLMIUM LASER APPLICATION;  Surgeon: Festus Aloe, MD;  Location: [facility];  Service: Urology;  Laterality: Right;  . NO PAST SURGERIES      FAMILY HISTORY: family history includes Bladder Cancer in his maternal uncle; Breast cancer in his maternal grandmother; Cancer in his maternal grandmother and maternal uncle; Leukemia in his maternal aunt.  SOCIAL HISTORY:  reports that he has never smoked. He has never used smokeless tobacco. He reports that he does not drink alcohol or use drugs.  He is married and lives at home in [place] with his wife and 2 small children ages 74 y/o and 57 y/o.  He works full-time as a Arts development officer.  ALLERGIES: Patient has no known allergies.  MEDICATIONS:  Current Outpatient Medications  Medication Sig Dispense Refill  . Multiple  Vitamins-Minerals (MULTIVITAMIN ADULT PO) Take by mouth.    . tamsulosin (FLOMAX) 0.4 MG CAPS capsule Take 1 capsule (0.4 mg total) by mouth daily. (Patient not taking: Reported on 05/09/2018) 10 capsule 0   No current facility-administered medications for this encounter.     REVIEW OF SYSTEMS:  On review of systems, the patient reports that he is doing  well overall. He denies any chest pain, shortness of breath, cough, fevers, chills, night sweats, unintended weight changes. He denies any bowel disturbances, and denies abdominal pain, nausea or vomiting. He denies any new musculoskeletal or joint aches or pains. His IPSS was 1, indicating very mild urinary symptoms. His SHIM was 25, indicating he does not have erectile dysfunction. A complete review of systems is obtained and is otherwise negative.   PHYSICAL EXAM:  Wt Readings from Last 3 Encounters:  05/09/18 199 lb 6 oz (90.4 kg)  05/27/15 208 lb (94.3 kg)   Temp Readings from Last 3 Encounters:  05/09/18 98 F (36.7 C) (Oral)  05/27/15 98.2 F (36.8 C)  05/16/15 98.3 F (36.8 C)   BP Readings from Last 3 Encounters:  05/09/18 133/75  05/27/15 128/81  05/16/15 116/81   Pulse Readings from Last 3 Encounters:  05/09/18 69  05/27/15 63  05/16/15 69   Pain Assessment Pain Score: 0-No pain/10  In general this is a well appearing African American gentleman in no acute distress. He is alert and oriented x4 and appropriate throughout the examination. HEENT reveals that the patient is normocephalic, atraumatic. EOMs are intact. PERRLA. Skin is intact without any evidence of gross lesions. Cardiovascular exam reveals a regular rate and rhythm, no clicks rubs or murmurs are auscultated. Chest is clear to auscultation bilaterally. Lymphatic assessment is performed and does not reveal any adenopathy in the cervical, supraclavicular, axillary, or inguinal chains. Abdomen has active bowel sounds in all quadrants and is intact. The abdomen is soft, non tender, non distended. Lower extremities are negative for pretibial pitting edema, deep calf tenderness, cyanosis or clubbing.  KPS = 100  100 - Normal; no complaints; no evidence of disease. 90   - Able to Blake on normal activity; minor signs or symptoms of disease. 80   - Normal activity with effort; some signs or symptoms of disease. 35   -  Cares for self; unable to Blake on normal activity or to do active work. 60   - Requires occasional assistance, but is able to care for most of his personal needs. 50   - Requires considerable assistance and frequent medical care. 10   - Disabled; requires special care and assistance. 29   - Severely disabled; hospital admission is indicated although death not imminent. 54   - Very sick; hospital admission necessary; active supportive treatment necessary. 10   - Moribund; fatal processes progressing rapidly. 0     - Dead  Karnofsky DA, Abelmann [place], Craver LS and Burchenal [facility] [phone]) The use of the nitrogen mustards in the palliative treatment of carcinoma: with particular reference to bronchogenic carcinoma Cancer 1 634-56   LABORATORY DATA:  Lab Results  Component Value Date   WBC 9.1 05/16/2015   HGB 15.6 05/16/2015   HCT 46.0 05/16/2015   MCV 86.5 05/16/2015   PLT 171 05/16/2015   Lab Results  Component Value Date   NA 141 05/16/2015   K 4.0 05/16/2015   CL 104 05/16/2015   No results found for: ALT, AST, GGT, ALKPHOS, BILITOT   RADIOGRAPHY: Nm Bone  Scan Whole Body  Result Date: 04/17/2018 CLINICAL DATA:  Recent diagnosis of prostate cancer. EXAM: NUCLEAR MEDICINE WHOLE BODY BONE SCAN TECHNIQUE: Whole body anterior and posterior images were obtained approximately 3 hours after intravenous injection of radiopharmaceutical. RADIOPHARMACEUTICALS:  21.8 mCi Technetium-37m MDP IV COMPARISON:  CT scan 04/17/2018 FINDINGS: Areas of moderate degenerative type uptake are noted in the shoulders, sternoclavicular joints, hips and knees. No findings suspicious for osseous metastatic disease. IMPRESSION: Areas of degenerative type uptake but no findings suspicious for osseous metastatic disease. Electronically Signed   By: Marijo Sanes M.D.   On: 04/17/2018 16:17      IMPRESSION/PLAN: 47 y.o. gentleman with Stage T2b adenocarcinoma of the prostate with a PSA of 56 and a Gleason score of 4+5.     We discussed the patient's workup and outlined the nature of prostate cancer in this setting. The patient's T stage, Gleason's score, and PSA put him into the high risk group. Accordingly, he is eligible for a variety of potential treatment options including prostatectomy or LT-ADT in combination with either 8 weeks of external radiation or and upfront brachytherapy boost followed by 5 weeks of external radiation. We reviewed the implications of T-stage, Gleason's Score, and PSA on decision-making and outcomes related to prostate cancer.  We discussed radiation treatment in the management of prostate cancer with regard to the logistics and delivery of external beam radiation treatment as well as the logistics and delivery of prostate brachytherapy.  We also detailed the role of ADT in the treatment of high risk prostate cancer and outlined the associated side effects that could be expected with this therapy. We compared and contrasted each of these approaches and also compared these against prostatectomy.    The patient focused most of his questions and interest in robotic-assisted laparoscopic radical prostatectomy.  We discussed some of the potential advantages of surgery including surgical staging, the availability of salvage radiotherapy to the prostatic fossa, and the confidence associated with immediate biochemical response.  We discussed some of the potential proven indications for postoperative radiotherapy including positive margins, extracapsular extension, and seminal vesicle involvement. We also talked about some of the other potential findings leading to a recommendation for radiotherapy including a non-zero postoperative PSA and positive lymph nodes.    At the conclusion of our conversation, the patient is leaning towards proceeding with prostatectomy. We enjoyed meeting with him and his wife today, and will look forward to participating in the care of this very nice gentleman should he elect  to proceed with radiotherapy in the management of his disease.  We will look forward to following his progress.   Nicholos Johns, PA-C    Tyler Pita, MD  [facility] Direct Dial: [phone]  Fax: [phone] [email or web address]  Skype  [email or web address]  This document serves as a record of services personally performed by Tyler Pita, MD and Freeman Caldron, PA-C. It was created on their behalf by Wilburn Mylar, a trained medical scribe. The creation of this record is based on the scribe's personal observations and the provider's statements to them. This document has been checked and approved by the attending provider.

## 2018-05-09 NOTE — Progress Notes (Signed)
GU Location of Tumor / Histology: prostatic adenocarcinoma  If Prostate Cancer, Gleason Score is (4 + 5) and PSA is (64). Prostate volume: 43 grams.   Blake Maxwell is an established patient of Dr. Junious Silk. He was noted to have an elevated PSA in September 2019.   Biopsies of prostate (if applicable) revealed:    Past/Anticipated interventions by urology, if any: prostate biopsy, referral to [facility], candidate for the PROTEUS. CT and bone scan normal.   Past/Anticipated interventions by medical oncology, if any: no  Weight changes, if any: no  Bowel/Bladder complaints, if any: He has a good flow and no frequency or urgency. He denies ED.    Nausea/Vomiting, if any: no  Pain issues, if any:    SAFETY ISSUES:  Prior radiation?   Pacemaker/ICD?   Possible current pregnancy? no  Is the patient on methotrexate?   Current Complaints / other details:  47 year old male. Married with two daughters.

## 2018-05-09 NOTE — Consult Note (Signed)
[facility]     05/09/2018   --------------------------------------------------------------------------------   Blake Maxwell  MRN: [id number]  PRIMARY CARE:    DOB: [birth year], 47 year old Male  REFERRING:    SSN: [id number]  PROVIDER:  Festus Aloe, M.D.    TREATING:  Raynelle Bring, M.D.    LOCATION:  [facility] [phone]   --------------------------------------------------------------------------------   CC/HPI: CC: Prostate Cancer   Physician requesting consult: Dr. Eda Keys  PCP: Dr. Emelia Loron  Location of consult: [facility] - Prostate Cancer Multidisciplinary Clinic   Blake Maxwell is a 47 year old gentleman who was found to have an elevated PSA of 56.3 and a left apical prostate nodule prompting a TRUS biopsy on 03/28/18 that confirmed Gleason 4+5=9 adenocarcinoma with 12 out of 12 biopsy cores positive for malignancy.   Family history: None.   Imaging studies:  CT abd/pelvis (04/17/18): No lymphadenopathy or sclerotic lesions. Two non-obstructing left renal calculi (largest 5 mm).  Bone scan (04/17/18): Negative for metastatic disease. Uptake at shoulders, sternoclavicular joints, hips, and knees consistent with degenerative changes.   PMH: No medical comorbidities.  PSH: No abdominal surgeries.   TNM stage: cT2b N0 M0  PSA: 56.3  Gleason score: 4+5=9  Biopsy (03/28/18): 12/12 cores positive  Left: L lateral apex (5%, 3+4=7, PNI), L apex (95%, 4+3=7), L lateral mid (20%, 3+4=7, PNI), L mid (95%, 4+3=7, PNI), L lateral base (70%, 4+5=9), L base (60%, 4+3=7, PNI)  Right: R apex (90%, 4+3=7), R lateral apex (80%, 4+3=7), R mid (90%, 4+3=7, PNI), R lateral mid (80%, 3+4=7), R base (90%, 3+4=7), R lateral base (60%, 4+3=7)  Prostate volume: 43.2 cc   Nomogram  OC disease: 2%  EPE: 98%  SVI: 85%  LNI: 82%  PFS (5 year, 10 year): 3%, 2%   Urinary function: IPSS is 1.  Erectile function: SHIM score is 25.      ALLERGIES: No Allergies    MEDICATIONS: Levaquin 750 mg tablet 1 tablet PO morning of procedure  Multiple Vitamins     GU PSH: Locm 300-399Mg /Ml Iodine,1Ml - 04/17/2018 Prostate Needle Biopsy - 03/28/2018 Ureteroscopic laser litho - 2016      PSH Notes: Cystoscopy Ureteroscopy Lithotripsy Incl Insert Indwelling Ureter Stent, No Surgical Problems   NON-GU PSH: Surgical Pathology, Gross And Microscopic Examination For Prostate Needle - 03/28/2018    GU PMH: Prostate Cancer - 04/18/2018 Elevated PSA - 03/28/2018, - 03/23/2018 Ureteral calculus, Right ureteral stone - 2017 Renal calculus, Nephrolithiasis - 2016      PMH Notes:  2015-05-19 10:37:35 - Note: No pertinent past medical history   NON-GU PMH: Encounter for general adult medical examination without abnormal findings, Encounter for preventive health examination - 2017    FAMILY HISTORY: 2 daughters - No Family History Diabetes - Father Hypertension - Father No pertinent family history - Runs In Family   SOCIAL HISTORY: Marital Status: Married Preferred Language: English; Race: Black or African American Current Smoking Status: Patient has never smoked.   Tobacco Use Assessment Completed: Used Tobacco in last 30 days? Does not use smokeless tobacco. Has never drank.  Does not drink caffeine. Has not had a blood transfusion.     Notes: Married, Caffeine use, Occupation, Never a smoker, Number of children, Alcohol use   REVIEW OF SYSTEMS:    GU Review Male:   Patient denies frequent urination, hard to postpone urination, burning/ pain with urination, get up at night to urinate, leakage of  urine, stream starts and stops, trouble starting your streams, and have to strain to urinate .  Gastrointestinal (Lower):   Patient denies diarrhea and constipation.  Gastrointestinal (Upper):   Patient denies nausea and vomiting.  Constitutional:   Patient denies fever, night sweats, weight loss, and fatigue.  Skin:   Patient  denies skin rash/ lesion and itching.  Eyes:   Patient denies blurred vision and double vision.  Ears/ Nose/ Throat:   Patient denies sore throat and sinus problems.  Hematologic/Lymphatic:   Patient denies swollen glands and easy bruising.  Cardiovascular:   Patient denies leg swelling and chest pains.  Respiratory:   Patient denies cough and shortness of breath.  Endocrine:   Patient denies excessive thirst.  Musculoskeletal:   Patient denies back pain and joint pain.  Neurological:   Patient denies headaches and dizziness.  Psychologic:   Patient denies depression and anxiety.   VITAL SIGNS: None   GU PHYSICAL EXAMINATION:    Prostate: Prostate about 50 grams. He does have bilateral firmness of the prostate. No definite extra prostatic extension noted.   MULTI-SYSTEM PHYSICAL EXAMINATION:    Constitutional: Well-nourished. No physical deformities. Normally developed. Good grooming.  Neck: Neck symmetrical, not swollen. Normal tracheal position.  Respiratory: No labored breathing, no use of accessory muscles. Clear bilaterally.  Cardiovascular: Normal temperature, normal extremity pulses, no swelling, no varicosities. RRR.  Lymphatic: No enlargement of neck, axillae, groin.  Skin: No paleness, no jaundice, no cyanosis. No lesion, no ulcer, no rash.  Neurologic / Psychiatric: Oriented to time, oriented to place, oriented to person. No depression, no anxiety, no agitation.  Gastrointestinal: No mass, no tenderness, no rigidity, non obese abdomen.  Eyes: Normal conjunctivae. Normal eyelids.  Ears, Nose, Mouth, and Throat: Left ear no scars, no lesions, no masses. Right ear no scars, no lesions, no masses. Nose no scars, no lesions, no masses. Normal hearing. Normal lips.  Musculoskeletal: Normal gait and station of head and neck.     PAST DATA REVIEWED:  Source Of History:  Patient  Lab Test Review:   PSA  Records Review:   Pathology Reports  X-Ray Review: C.T. Abdomen/Pelvis:  Reviewed Films.  Bone Scan: Reviewed Films.     03/23/18  PSA  Total PSA 56.30 ng/mL    PROCEDURES: None   ASSESSMENT:      ICD-10 Details  1 GU:   Prostate Cancer - C61    PLAN:           Document Letter(s):  Created for Patient: Clinical Summary         Notes:   1. High risk prostate cancer: Mr. Brodowski was seen with his wife today regarding his very high risk prostate cancer. The patient was counseled about the natural history of prostate cancer and the standard treatment options that are available for prostate cancer. It was explained to him how his age and life expectancy, clinical stage, Gleason score, and PSA affect his prognosis, the decision to proceed with additional staging studies, as well as how that information influences recommended treatment strategies. We discussed the roles for active surveillance, radiation therapy, surgical therapy, androgen deprivation, as well as ablative therapy options for the treatment of prostate cancer as appropriate to his individual cancer situation. We discussed the risks and benefits of these options with regard to their impact on cancer control and also in terms of potential adverse events, complications, and impact on quality of life particularly related to urinary and sexual function. The patient  was encouraged to ask questions throughout the discussion today and all questions were answered to his stated satisfaction. In addition, the patient was provided with and/or directed to appropriate resources and literature for further education about prostate cancer and treatment options. We discussed surgical therapy for prostate cancer including the different available surgical approaches. We discussed, in detail, the risks and expectations of surgery with regard to cancer control, urinary control, and erectile function as well as the expected postoperative recovery process. Additional risks of surgery including but not limited to bleeding, infection,  hernia formation, nerve damage, lymphocele formation, bowel/rectal injury potentially necessitating colostomy, damage to the urinary tract resulting in urine leakage, urethral stricture, and the cardiopulmonary risks such as myocardial infarction, stroke, death, venothromboembolism, etc. were explained. The risk of open surgical conversion for robotic/laparoscopic prostatectomy was also discussed.    I had a detailed discussion with him and his wife today regarding his very high risk prostate cancer. We reviewed options including primary surgical therapy in a multimodality setting versus primary radiation therapy with long-term androgen deprivation. We also discussed the option of proceeding with surgical therapy in the context of the PROTEUS trial. He will consider his options.   If he does elect to proceed with surgical therapy, my recommendation would be to perform a non nerve-sparing robot assisted laparoscopic radical prostatectomy and bilateral pelvic lymphadenectomy. If he does consider the PROTEUS trial, I did offer him to consider an MRI of the prostate prior to proceeding with surgery along with a repeat digital rectal exam to see if a nerve-sparing procedure would be feasible after 6 months of neoadjuvant hormonal treatment. He will notify me if I can be of assistance to him.   Cc: Dr. Eda Keys  Dr. Emelia Loron

## 2018-05-10 ENCOUNTER — Telehealth: Payer: Self-pay

## 2018-05-10 NOTE — Telephone Encounter (Signed)
Per 11/26 no los 

## 2018-05-25 ENCOUNTER — Telehealth: Payer: Self-pay | Admitting: Medical Oncology

## 2018-05-25 NOTE — Telephone Encounter (Signed)
I called Blake Maxwell to confirm his treatment chose. He had called on Tuesday requested ADT. I asked if he is interested the Proteus Trial or ADT and radiation. He does not wish to do radiation. He is interested in the trial with ADT then prostatectomy. He spoke with research at [facility] and they are mailing him a packet of information.

## 2018-05-25 NOTE — Telephone Encounter (Signed)
I spoke with Blake Maxwell at [facility] to confirm she has spoken with patient about the Proteus trial. She states she has and she mailed him a packet of information. He will review this weekend and get back with her. She will keep me updated.

## 2018-05-25 NOTE — Progress Notes (Signed)
Blake Maxwell called today stating he has decided on hormone treatment for his prostate cancer. He asked if I could let Dr. Junious Silk know.

## 2018-05-29 ENCOUNTER — Telehealth: Payer: Self-pay | Admitting: Medical Oncology

## 2018-05-30 NOTE — Telephone Encounter (Signed)
Blake Maxwell called stating he received his packet of information from [facility] regarding the Proteus trial. He is interested in beginning the trial. I called Amy-research at [facility] to let her know. She will reach out to Blake Maxwell.

## 2018-06-09 ENCOUNTER — Telehealth: Payer: Self-pay | Admitting: Medical Oncology

## 2018-06-09 NOTE — Telephone Encounter (Signed)
Amy-research nurse with [facility] called stating patient is moving forward with the Proteus trial. He has signed consent, had labs and received his Lupron 45 mg on 06/05/18. If he meets all criteria he will be enrolled in late January. She will keep me posted.

## 2018-07-06 ENCOUNTER — Other Ambulatory Visit: Payer: Self-pay | Admitting: Urology

## 2018-07-17 ENCOUNTER — Ambulatory Visit: Payer: Managed Care, Other (non HMO) | Admitting: Physical Therapy

## 2018-10-19 ENCOUNTER — Other Ambulatory Visit: Payer: Self-pay | Admitting: Urology

## 2018-11-09 ENCOUNTER — Other Ambulatory Visit (HOSPITAL_COMMUNITY): Payer: Self-pay | Admitting: Urology

## 2018-11-09 DIAGNOSIS — C61 Malignant neoplasm of prostate: Secondary | ICD-10-CM

## 2018-11-21 ENCOUNTER — Ambulatory Visit: Payer: Managed Care, Other (non HMO) | Attending: Urology | Admitting: Physical Therapy

## 2018-11-21 ENCOUNTER — Encounter: Payer: Self-pay | Admitting: Physical Therapy

## 2018-11-21 ENCOUNTER — Other Ambulatory Visit: Payer: Self-pay

## 2018-11-21 DIAGNOSIS — M6281 Muscle weakness (generalized): Secondary | ICD-10-CM | POA: Diagnosis present

## 2018-11-21 DIAGNOSIS — R279 Unspecified lack of coordination: Secondary | ICD-10-CM | POA: Insufficient documentation

## 2018-11-21 DIAGNOSIS — M62838 Other muscle spasm: Secondary | ICD-10-CM | POA: Diagnosis present

## 2018-11-21 NOTE — Therapy (Signed)
[facility] [address], [place], [place], [address] Phone: [phone]   Fax:  [phone]  Physical Therapy Evaluation  Patient Details  Name: Blake Maxwell MRN: [id number] Date of Birth: [birth year] Referring Provider (PT): Raynelle Bring, MD   Encounter Date: 11/21/2018  PT End of Session - 11/21/18 1943    Visit Number  1    Date for PT Re-Evaluation  02/13/19    PT Start Time  1900    PT Stop Time  1941    PT Time Calculation (min)  41 min    Activity Tolerance  Patient tolerated treatment well       Past Medical History:  Diagnosis Date  . Chronic kidney disease   . History of kidney stones   . Prostate cancer [facility])     Past Surgical History:  Procedure Laterality Date  . CYSTOSCOPY WITH RETROGRADE PYELOGRAM, URETEROSCOPY AND STENT PLACEMENT Right 05/27/2015   Procedure: CYSTOSCOPY WITH RIGHT  RETROGRADE PYELOGRAM, URETEROSCOPY AND STENT PLACEMENT;  Surgeon: Festus Aloe, MD;  Location: [facility];  Service: Urology;  Laterality: Right;  . HOLMIUM LASER APPLICATION Right 41/93/7902   Procedure: HOLMIUM LASER APPLICATION;  Surgeon: Festus Aloe, MD;  Location: [facility];  Service: Urology;  Laterality: Right;  . NO PAST SURGERIES      There were no vitals filed for this visit.       [facility] PT Assessment - 11/21/18 0001      Assessment   Medical Diagnosis  C61 (ICD-10-CM) - Malignant neoplasm of prostate    Referring Provider (PT)  Raynelle Bring, MD    Onset Date/Surgical Date  --   upcoming surgery 7/13   Prior Therapy  No      Precautions   Precautions  None      Restrictions   Weight Bearing Restrictions  No      Balance Screen   Has the patient fallen in the past 6 months  No      [place] residence    Living Arrangements  Spouse/significant other;Children      Prior Function   Level of Independence  Independent    Vocation  Full time  employment    Vocation Requirements  driving      Cognition   Overall Cognitive Status  Within Functional Limits for tasks assessed      Posture/Postural Control   Posture/Postural Control  Postural limitations    Postural Limitations  Rounded Shoulders      ROM / Strength   AROM / PROM / Strength  Strength      Strength   Overall Strength Comments  LE grossly 5/5      Flexibility   Soft Tissue Assessment /Muscle Length  yes    Hamstrings  20% limited      Ambulation/Gait   Gait Pattern  Within Functional Limits                Objective measurements completed on examination: See above findings.    Pelvic Floor Special Questions - 11/21/18 0001    Urinary Leakage  No    Fecal incontinence  No    Biofeedback  rest 61mV, quick 40mV max, 10 sec hold, 33mV ave, 20 sec 59mV, rest        [facility] Adult PT Treatment/Exercise - 11/21/18 0001      Neuro Re-ed    Neuro Re-ed Details   biofeedback to review initial  HEP and ensure doing kegels correctly             PT Education - 11/21/18 2000    Education Details   Access Code: [email or web address]     Person(s) Educated  Patient    Methods  Explanation;Demonstration;Tactile cues;Verbal cues;Handout    Comprehension  Verbalized understanding;Returned demonstration       PT Short Term Goals - 11/21/18 1949      PT SHORT TERM GOAL #1   Title  Pt will be ind with exercises to do prior to surgery    Time  4    Period  Weeks    Status  New    Target Date  12/19/18        PT Long Term Goals - 11/21/18 1949      PT LONG TERM GOAL #1   Title  Pt will be able to sustain 10 second pelvic floor contraction at 34mV for improved surgery outcomes    Time  12    Period  Weeks    Status  New    Target Date  02/13/19      PT LONG TERM GOAL #2   Title  Pt will be ind with advanced HEP    Time  12    Period  Weeks    Status  New    Target Date  02/13/19             Plan - 11/21/18 1953    Clinical Impression  Statement  Pt is going to have prostatectomy in one month.  He has come to clinic to be provided with exercise plan and information on what to expect after surgery for best outcomes.  Pt has some limited hamstring length.  He has very low resting tone of pelvic floor and demonstrates fatigue with exercises.  Pt needed cues for correct posture when contracting his pelvic floor.  He had some difficulty coordinating pelvic contraction to sustain for >10 sec    Personal Factors and Comorbidities  Comorbidity 1    Comorbidities  prostate cancer    Stability/Clinical Decision Making  Evolving/Moderate complexity    Clinical Decision Making  Low    Rehab Potential  Excellent    PT Frequency  2x / week    PT Duration  12 weeks    PT Treatment/Interventions  ADLs/Self Care Home Management;Biofeedback;Cryotherapy;Electrical Stimulation;Moist Heat;Therapeutic activities;Therapeutic exercise;Neuromuscular re-education;Patient/family education;Manual techniques;Dry needling;Taping    PT Next Visit Plan  body mechanics with ab bracing, breathing, pelvic contraction 20 sec, quick flicks sitting and standing, squats, water intake, walking program    PT Home Exercise Plan   Access Code: [email or web address]     Consulted and Agree with Plan of Care  Patient       Patient will benefit from skilled therapeutic intervention in order to improve the following deficits and impairments:  Decreased strength, Decreased coordination, Decreased activity tolerance, Decreased knowledge of precautions, Decreased range of motion  Visit Diagnosis: Muscle weakness (generalized)  Other muscle spasm  Unspecified lack of coordination     Problem List Patient Active Problem List   Diagnosis Date Noted  . Malignant neoplasm of prostate ([place]) 05/09/2018    Blake Maxwell 11/21/2018, 8:05 PM  [facility] [address] [place], [place], [address] Phone: [phone]    Fax:  [phone]  Name: Blake Maxwell MRN: [id number] Date of Birth: 1971-02-08

## 2018-11-21 NOTE — Patient Instructions (Signed)
Access Code: [email or web address]  URL: [email or web address]  Date: 11/21/2018  Prepared by: Jari Favre   Exercises  Supine Pelvic Floor Stretch - Hands on Knees - 10 reps - 3 sets - 1x daily - 7x weekly  Standing Hamstring Stretch with Step - 3 reps - 1 sets - 30 sec hold - 1x daily - 7x weekly  Hamstring Towel Stretch - 10 reps - 3 sets - 1x daily - 7x weekly  Seated Pelvic Floor Contraction - 10 reps - 1 sets - 10 sec hold - 3x daily - 7x weekly  Seated Isometric Hip Adduction with Pelvic Floor Contraction - 10 reps - 1 sets - 10 sec hold - 1x daily - 7x weekly  Seated Hip Abduction with Pelvic Floor Contraction and Resistance Loop - 10 reps - 3 sets - 3 sec hold - 1x daily - 7x weekly  Sit to Stand with Pelvic Floor Contraction - 10 reps - 1 sets - 3x daily - 7x weekly

## 2018-11-28 ENCOUNTER — Ambulatory Visit: Payer: Managed Care, Other (non HMO) | Admitting: Physical Therapy

## 2018-11-28 ENCOUNTER — Other Ambulatory Visit: Payer: Self-pay

## 2018-11-28 DIAGNOSIS — R279 Unspecified lack of coordination: Secondary | ICD-10-CM

## 2018-11-28 DIAGNOSIS — M62838 Other muscle spasm: Secondary | ICD-10-CM

## 2018-11-28 DIAGNOSIS — M6281 Muscle weakness (generalized): Secondary | ICD-10-CM | POA: Diagnosis not present

## 2018-11-28 NOTE — Patient Instructions (Addendum)
Pre-Prostectomy Physical Therapy Program  Water Intake: You should be drinking 8 glasses of water to decrease the irritants in the bladder and to promote healing. Reduce the number of bladder irritants 0-1 glasses per day. Incontinence Products: Briefs-underwear that have a padded area in the front Penile Clamp- clamp you place on the penis that is cushioned to stop the flow of urine Incontinence pads that is disposable Men's acticuf- pouch that goes around penis to manage light to moderate incontinence. Strengthening Program: 1. Gluteal squeeze-lie on your back or sit in a chair. Squeeze your buttocks for 5 seconds 5 reps. every hour. 2. Transverse Abdominus Contraction - lay on your back, push your belly button down to the mat, hands in front and curl upward to your shoulder blade 5x 3x per day. 3. Pelvic floor contraction 4. Piriformis stretch- supine and hip rotation 5. Sitting hamstring stretch  After surgery 1.  Catheter- after surgery a catheter will stay in for 7-10 days. After the catheter is removed you are able to resume the gluteal contraction, abdominal contractions and pelvic floor contractions 2. Bed positioning- pillows to support right hip and knee in sidely. 3. Use ice to perineum 10 min for 3 times per day 4. Self massage the perineum 5. Correct sitting posture 6. When to call MD: 1. Blood in urine 2. Increased pain in right leg at night   [place], [place] [address] [phone]  Toileting Techniques for Bowel Movements (Defecation)  Using your belly (abdomen) and pelvic floor muscles to have a bowel movement is usually instinctive.  Sometimes people can have problems with these muscles and have to relearn proper defecation (emptying) techniques.  If you have weakness in your muscles, organs that are falling out, decreased sensation in your pelvis, or ignore your urge to go, you may find yourself straining to have a bowel movement.  You are  straining if you are:  . holding your breath or taking in a huge gulp of air and holding it  . keeping your lips and jaw tensed and closed tightly . turning red in the face because of excessive pushing or forcing . developing or worsening your  hemorrhoids . getting faint while pushing . not emptying completely and have to defecate many times a day  If you are straining, you are actually making it harder for yourself to have a bowel movement.  Many people find they are pulling up with the pelvic floor muscles and closing off instead of opening the anus. Due to lack pelvic floor relaxation and coordination the abdominal muscles, one has to work harder to push the feces out.  Many people have never been taught how to defecate efficiently and effectively.  Notice what happens to your body when you are having a bowel movement.  While you are sitting on the toilet pay attention to the following areas: . Jaw and mouth position . Angle of your hips   . Whether your feet touch the ground or not . Arm placement . Spine position . Waist . Belly tension . Anus (opening of the anal canal)  An Evacuation/Defecation Plan   Here are the 4 basic points:  1. Lean forward enough for your elbows to rest on your knees 2. Support your feet on the floor or use a low stool if your feet don't touch the floor  3. Push out your belly as if you have swallowed a beach ball-you should feel a widening of your waist 4. Open and  relax your pelvic floor muscles, rather than tightening around the anus   The following conditions my require modifications to your toileting posture:  . If you have had surgery in the past that limits your back, hip, pelvic, knee or ankle flexibility . Constipation   Your healthcare practitioner may make the following additional suggestions and adjustments:  1) Sit on the toilet  a) Make sure your feet are supported. b) Notice your hip angle and spine position-most people find it  effective to lean forward or raise their knees, which can help the muscles around the anus to relax  c) When you lean forward, place your forearms on your thighs for support  2) Relax suggestions a) Breath deeply in through your nose and out slowly through your mouth as if you are smelling the flowers and blowing out the candles. b) To become aware of how to relax your muscles, contracting and releasing muscles can be helpful.  Pull your pelvic floor muscles in tightly by using the image of holding back gas, or closing around the anus (visualize making a circle smaller) and lifting the anus up and in.  Then release the muscles and your anus should drop down and feel open. Repeat 5 times ending with the feeling of relaxation. Concentric Contraction (Hook-Lying)  c) Keep your pelvic floor muscles relaxed; let your belly bulge out. d) The digestive tract starts at the mouth and ends at the anal opening, so be sure to relax both ends of the tube.  Place your tongue on the roof of your mouth with your teeth separated.  This helps relax your mouth and will help to relax the anus at the same time.  3) Empty (defecation) a) Keep your pelvic floor and sphincter relaxed, then bulge your anal muscles.  Make the anal opening wide.  b) Stick your belly out as if you have swallowed a beach ball. c) Make your belly wall hard using your belly muscles while continuing to breathe. Doing this makes it easier to open your anus. d) Breath out and give a grunt (or try using other sounds such as ahhhh, shhhhh, ohhhh or grrrrrrr).  4) Finish a) As you finish your bowel movement, pull the pelvic floor muscles up and in.  This will leave your anus in the proper place rather than remaining pushed out and down. If you leave your anus pushed out and down, it will start to feel as though that is normal and give you incorrect signals about needing to have a bowel movement.   2007, [employer] Doc.23 Concentric  Contraction (Hook-Lying)   Lie with hips and knees bent. Slowly inhale, and then exhale. Pull navel toward spine. Contract pelvic floor. Hold 5 seconds  Relax. Repeat _5__ times. Do _5__ times a day. Perform in sittting 5 times hold 5 seconds 2 times per day  Copyright  VHI. All rights reserved.  Buttocks Squeeze   Sitting, lying or standing, squeeze buttocks together while counting out loud to 5. Repeat _5___ times. Do __5__ sessions per day.  http://gt2.exer.us/561   Copyright  VHI. All rights reserved.    Lay on your back with legs crossed and put ball between ankles. Squeeze for 5 seconds 5x, 2x/day  Hip Stretch   Put right ankle over left knee. Let right knee fall downward, but keep ankle in place. Feel the stretch in hip. May push down gently with hand to feel stretch. Hold _5___ seconds while counting out loud. Repeat with other leg. Do  laying on back with knees bent. Repeat __5__ times. Do _2___ sessions per day.  http://gt2.exer.us/497  Hamstring Stretch (Sitting)   Sitting, 2 legs on floor,  extend one leg and place hands on same thigh for support. Keeping torso straight, lean forward, sliding hands down leg, until a stretch is felt in back of thigh. Hold _30___ seconds. 2 times 2 times per day Repeat with other leg.  Copyright  VHI. All rights reserved.    Copyright  VHI. All rights reserved.  External Rotation: Hip - Knees Apart With Pelvic Floor (Hook-Lying)   Lie with hips and knees bent, band tied just above knees. Squeeze pelvic floor while pulling knees apart. Hold for _1__ seconds. Rest for _1__ seconds. Repeat _5__ times. Do _2  Copyright  VHI. All rights reserved. Bridging   Slowly raise buttocks from floor, keeping stomach tight. Repeat _10___ times per set. Do __1__ sets per session. Do __2__ sessions per day. Then place pillow between knees, contract pelvic floor, lift hips up then down. 10x 2 times per day.  http://[place].exer.us/1096    Copyright  VHI. All rights reserved.   Butterfly, Supine    Lie on back, feet together. Lower knees toward floor. Hold _1 min. Repeat __1_ times per session. Do _1__ sessions per day.  Copyright  VHI. All rights reserved. The Male Pelvic Floor Muscles  The pelvic floor consists of several layers of muscles that cover the bottom of the pelvic cavity. These muscles have several distinct roles:  6. To support the pelvic organs, the bladder and colon within the pelvis. 7. To assist in stopping and starting the flow of urine or the passage of gas or stool. 8. To aid in sexual appreciation.    How to Locate the Pelvic Floor Muscles  The Urine Stop Test . At the midstream of your urine flow, squeeze the pelvic floor muscles. You should feel the sensation of the openings close and the muscles pulling the penis and anus up and in to the pelvic cavity.  If you have strong muscles you will slow or stop the stream of urine. . Try to stop or slow the flow of urine without tensing the muscles of your legs, buttocks. . Do this only to locate the muscles, not as a daily exercise. Feeling the Muscle . Place a fingertip on or into the rectal opening.  Contract and lift the muscles as though you were holding back gas or a bowel movement.   . You will feel your anal opening tighten and your penis move slightly. Watching the Muscles Contract . Begin by lying on a flat surface.  Position yourself with your knees apart and bent with your head elevated and supported on several pillows.  Use a mirror to look at the anal opening and penis.  . Contract or tighten the muscles around the anal opening and watch for a puckering and lifting of the anus and slight movement of the penis.   . If you see a bulge of your anus this is an incorrect contraction and you should notify your health care provider for more instructions.   2007, [employer] Doc.12 Patient was verbally instructed on bladder  irritants and how they can affect the bladder. See handout on Pre-Prostectomy program.  Patient returned all of the above exercises correctly.   Access Code: [email or web address]  URL: [email or web address]  Date: 11/28/2018  Prepared by: Jari Favre   Exercises  Supine Pelvic Floor Stretch - Hands on Knees - 10 reps -  3 sets - 1x daily - 7x weekly  Standing Hamstring Stretch with Step - 3 reps - 1 sets - 30 sec hold - 1x daily - 7x weekly  Hamstring Towel Stretch - 10 reps - 3 sets - 1x daily - 7x weekly  Seated Pelvic Floor Contraction - 10 reps - 1 sets - 10 sec hold - 3x daily - 7x weekly  Seated Isometric Hip Adduction with Pelvic Floor Contraction - 10 reps - 1 sets - 10 sec hold - 1x daily - 7x weekly  Seated Hip Abduction with Pelvic Floor Contraction and Resistance Loop - 10 reps - 3 sets - 3 sec hold - 1x daily - 7x weekly  Sit to Stand with Pelvic Floor Contraction - 10 reps - 1 sets - 3x daily - 7x weekly  Supine Transversus Abdominis Bracing - Hands on Stomach - 10 reps - 2 sets - 1x daily - 7x weekly  Hooklying Small March - 10 reps - 2 sets - 1x daily - 7x weekly  Supine Heel Slide - 10 reps - 2 sets - 1x daily - 7x weekly  Supine Bridge - 10 reps - 2 sets - 1x daily - 7x weekly  Runner's Step Up/Down - 10 reps - 3 sets - 1x daily - 7x weekly

## 2018-11-28 NOTE — Therapy (Signed)
[facility] [address], [place], [place], [address] Phone: [phone]   Fax:  [phone]  Physical Therapy Treatment  Patient Details  Name: Blake Maxwell MRN: [id number] Date of Birth: [birth year] Referring Provider (PT): Raynelle Bring, MD   Encounter Date: 11/28/2018  PT End of Session - 11/28/18 1948    Visit Number  2    Date for PT Re-Evaluation  02/13/19    PT Start Time  1858    PT Stop Time  1938    PT Time Calculation (min)  40 min    Activity Tolerance  Patient tolerated treatment well       Past Medical History:  Diagnosis Date  . Chronic kidney disease   . History of kidney stones   . Prostate cancer [facility])     Past Surgical History:  Procedure Laterality Date  . CYSTOSCOPY WITH RETROGRADE PYELOGRAM, URETEROSCOPY AND STENT PLACEMENT Right 05/27/2015   Procedure: CYSTOSCOPY WITH RIGHT  RETROGRADE PYELOGRAM, URETEROSCOPY AND STENT PLACEMENT;  Surgeon: Festus Aloe, MD;  [facility];  Service: Urology;  Laterality: Right;  . HOLMIUM LASER APPLICATION Right 65/68/1275   Procedure: HOLMIUM LASER APPLICATION;  Surgeon: Festus Aloe, MD;  Location[facility];  Service: Urology;  Laterality: Right;  . NO PAST SURGERIES      There were no vitals filed for this visit.  Subjective Assessment - 11/28/18 1952    Subjective  I did the exercises and could definitely feel it working my core.    Currently in Pain?  No/denies                       [facility] Adult PT Treatment/Exercise - 11/28/18 0001      Self-Care   Self-Care  Other Self-Care Comments    Other Self-Care Comments   toilet technique and self massage to the perineum      Therapeutic Activites    Therapeutic Activities  Other Therapeutic Activities    Other Therapeutic Activities  bed mobility with TrA activation      Neuro Re-ed    Neuro Re-ed Details   TrA and pelvic floor activation      Exercises   Exercises   Lumbar      Lumbar Exercises: Standing   Other Standing Lumbar Exercises  step up - 20x each side      Lumbar Exercises: Seated   Sit to Stand  10 reps      Lumbar Exercises: Supine   Heel Slides  10 reps    Bent Knee Raise  10 reps    Bridge  10 reps;5 seconds             PT Education - 11/28/18 1940    Education Details  Access Code: [email or web address] , toilet techniques, preprostatectomy guide    Person(s) Educated  Patient    Methods  Explanation;Demonstration;Handout;Verbal cues    Comprehension  Verbalized understanding;Returned demonstration       PT Short Term Goals - 11/28/18 1946      PT SHORT TERM GOAL #1   Title  Pt will be ind with exercises to do prior to surgery    Status  On-going        PT Long Term Goals - 11/21/18 1949      PT LONG TERM GOAL #1   Title  Pt will be able to sustain 10 second pelvic floor contraction at 79mV for improved surgery outcomes  Time  12    Period  Weeks    Status  New    Target Date  02/13/19      PT LONG TERM GOAL #2   Title  Pt will be ind with advanced HEP    Time  12    Period  Weeks    Status  New    Target Date  02/13/19            Plan - 11/28/18 1943    Clinical Impression Statement  Pt did well with TrA exercises today.  He was able to correctly activate muscles with bed mobility and all functoinal movments  Pt understands toileting techniques and self massage to perineum.  Pt will benefit from one more session of skilled PT prior to surgery in order to get complete list of exercises and review penile clamp and pump protocols for maximum success after surgery.    PT Treatment/Interventions  ADLs/Self Care Home Management;Biofeedback;Cryotherapy;Electrical Stimulation;Moist Heat;Therapeutic activities;Therapeutic exercise;Neuromuscular re-education;Patient/family education;Manual techniques;Dry needling;Taping    PT Next Visit Plan  breathing, pelvic contraction 20 sec, quick flicks sitting and standing,  squats, quadruped exercises, advanced exercises, penile pump and clamp protocol    PT Home Exercise Plan   Access Code: [email or web address]     Consulted and Agree with Plan of Care  Patient       Patient will benefit from skilled therapeutic intervention in order to improve the following deficits and impairments:  Decreased strength, Decreased coordination, Decreased activity tolerance, Decreased knowledge of precautions, Decreased range of motion  Visit Diagnosis: 1. Muscle weakness (generalized)   2. Other muscle spasm   3. Unspecified lack of coordination        Problem List Patient Active Problem List   Diagnosis Date Noted  . Malignant neoplasm of prostate ([place]) 05/09/2018    Jule Ser, PT 11/28/2018, 7:53 PM  [facility] [address], [place], [place], [address] Phone: [phone]   Fax:  [phone]  Name: Blake Maxwell MRN: [id number] Date of Birth: 1971/05/05

## 2018-12-05 ENCOUNTER — Encounter: Payer: Self-pay | Admitting: Physical Therapy

## 2018-12-05 ENCOUNTER — Ambulatory Visit: Payer: Managed Care, Other (non HMO) | Admitting: Physical Therapy

## 2018-12-05 ENCOUNTER — Other Ambulatory Visit: Payer: Self-pay

## 2018-12-05 DIAGNOSIS — M62838 Other muscle spasm: Secondary | ICD-10-CM

## 2018-12-05 DIAGNOSIS — M6281 Muscle weakness (generalized): Secondary | ICD-10-CM

## 2018-12-05 DIAGNOSIS — R279 Unspecified lack of coordination: Secondary | ICD-10-CM

## 2018-12-05 NOTE — Patient Instructions (Addendum)
Penile Pump Protocol Prior to placing pump on penis, retract the foreskin and shave the pubic hair Place water based lubricant only on the ring.  Place pump over the penis to the base.  Use slow gentle compressions till erection Maintain erection for 5 seconds then release, So for 10-20 erections for 10 min per day for 4 weeks Then progress to  3 minutes on, 1 minute off, 3 repetitions of this sequence for 3 times per week up to 12 months For post-prostatectomy, use as early as 4 weeks post-operatively, up to 12 months  Device may be covered by insurance if billed for "penile rehabilitation"  The device is to stretch and increase strength of penis You Tube vides "Physiotherapy Penile Rehab for Erectile Dysfunction ( use of vacuum pump)  Types of devices Penile pumps  1. Vacurect Vacuum Therapy Erectile Dysfunction Device  by Vacurect Can get on [employer] .  2. Active Erection System [email or web address]   Clamp Protocol 1.  Fit penile clamp-[employer]ner or [employer] 2. Wear all day  during the waking hours ONLY. Do not wear when sleeping. 3. Place clamp just before the glans penis (head of penis) 4. Wear for a minimum of 2 hours, max 4 hours 5. Wear 6 days/week, with one day off to re-assess 6. Bladder Chart on the day off 7. On day off see how long you can go without leaking but continue urinating 2 hours  8. When weaning off the clamp wear a pad just in case of leakage. 9. Wear 4-6 weeks, , wean off pads 10. May use in combination of medication that doctor may give you        [facility] [address] [place], [place] [address] Phone # [phone] Fax [phone]   Access Code: [email or web address]  URL: [email or web address]  Date: 12/05/2018  Prepared by: Jari Favre   Exercises  Supine Pelvic Floor Stretch - Hands on Knees - 10 reps - 3 sets - 1x daily - 7x weekly  Supine Figure 4 Piriformis Stretch - 3 reps - 1 sets - 30  sec hold - 1x daily - 7x weekly  Standing Hamstring Stretch with Step - 3 reps - 1 sets - 30 sec hold - 1x daily - 7x weekly  Hamstring Towel Stretch - 10 reps - 3 sets - 1x daily - 7x weekly  Seated Pelvic Floor Contraction - 10 reps - 1 sets - 10 sec hold - 3x daily - 7x weekly  Seated Isometric Hip Adduction with Pelvic Floor Contraction - 10 reps - 1 sets - 10 sec hold - 1x daily - 7x weekly  Seated Hip Abduction with Pelvic Floor Contraction and Resistance Loop - 10 reps - 3 sets - 3 sec hold - 1x daily - 7x weekly  Sit to Stand with Pelvic Floor Contraction - 10 reps - 1 sets - 3x daily - 7x weekly  Supine Transversus Abdominis Bracing - Hands on Stomach - 10 reps - 2 sets - 1x daily - 7x weekly  Hooklying Small March - 10 reps - 2 sets - 1x daily - 7x weekly  Supine Heel Slide - 10 reps - 2 sets - 1x daily - 7x weekly  Supine Bridge - 10 reps - 2 sets - 1x daily - 7x weekly  Runner's Step Up/Down - 10 reps - 3 sets - 1x daily - 7x weekly  Quadruped Pelvic Floor Contraction with Opposite Arm and Leg Lift - 10 reps - 3 sets - 1x daily -  7x weekly  Step Up with Pelvic Floor Contraction - 10 reps - 3 sets - 1x daily - 7x weekly  Mini Squat with Pelvic Floor Contraction - 10 reps - 3 sets - 1x daily - 7x weekly  Walking with Pelvic Floor Contraction - 10 reps - 3 sets - 1x daily - 7x weekly

## 2018-12-05 NOTE — Therapy (Addendum)
[facility] [address], [place], [place], [address] Phone: [phone]   Fax:  [phone]  Physical Therapy Treatment  Patient Details  Name: Blake Maxwell MRN: [id number] Date of Birth: [birth year] Referring Provider (PT): Raynelle Bring, MD   Encounter Date: 12/05/2018  PT End of Session - 12/05/18 1756    Visit Number  3    Date for PT Re-Evaluation  02/13/19    PT Start Time  6712    PT Stop Time  4580    PT Time Calculation (min)  34 min    Activity Tolerance  Patient tolerated treatment well       Past Medical History:  Diagnosis Date  . Chronic kidney disease   . History of kidney stones   . Prostate cancer [facility])     Past Surgical History:  Procedure Laterality Date  . CYSTOSCOPY WITH RETROGRADE PYELOGRAM, URETEROSCOPY AND STENT PLACEMENT Right 05/27/2015   Procedure: CYSTOSCOPY WITH RIGHT  RETROGRADE PYELOGRAM, URETEROSCOPY AND STENT PLACEMENT;  Surgeon: Festus Aloe, MD;  Location: [facility];  Service: Urology;  Laterality: Right;  . HOLMIUM LASER APPLICATION Right 99/83/3825   Procedure: HOLMIUM LASER APPLICATION;  Surgeon: Festus Aloe, MD;  Location: [facility];  Service: Urology;  Laterality: Right;  . NO PAST SURGERIES      There were no vitals filed for this visit.  Subjective Assessment - 12/05/18 1826    Subjective  I have been doing the stuff you gave me.  Just trying to work on the bed movements.    Currently in Pain?  No/denies                       [facility] Adult PT Treatment/Exercise - 12/05/18 0001      Self-Care   Other Self-Care Comments   penile pump and clamp protocol, walking with pelvic contract/relax      Neuro Re-ed    Neuro Re-ed Details   TrA and pelvic floor activation      Lumbar Exercises: Standing   Other Standing Lumbar Exercises  step up - 20x each side      Lumbar Exercises: Supine   Bridge  10 reps;5 seconds    Bridge with March  10 reps;1  second      Lumbar Exercises: Quadruped   Single Arm Raise  Right;Left;10 reps    Opposite Arm/Leg Raise  Right arm/Left leg;Left arm/Right leg;10 reps             PT Education - 12/05/18 1827    Education Details  Access Code: [email or web address] , penile pump and clamp protocol    Person(s) Educated  Patient    Methods  Explanation;Demonstration;Handout;Verbal cues    Comprehension  Verbalized understanding;Returned demonstration       PT Short Term Goals - 12/05/18 1829      PT SHORT TERM GOAL #1   Title  Pt will be ind with exercises to do prior to surgery    Status  Achieved        PT Long Term Goals - 12/05/18 1829      PT LONG TERM GOAL #1   Title  Pt will be able to sustain 10 second pelvic floor contraction at 10m for improved surgery outcomes    Status  On-going      PT LONG TERM GOAL #2   Title  Pt will be ind with advanced HEP    Status  On-going  Plan - 12/05/18 1828    Clinical Impression Statement  Pt did well with exercises and verbalizes understanding of all education provided.  He will return after surgery if needed.    PT Treatment/Interventions  ADLs/Self Care Home Management;Biofeedback;Cryotherapy;Electrical Stimulation;Moist Heat;Therapeutic activities;Therapeutic exercise;Neuromuscular re-education;Patient/family education;Manual techniques;Dry needling;Taping    PT Next Visit Plan  re-assess post prostatectomy    PT Home Exercise Plan   Access Code: [email or web address]     Consulted and Agree with Plan of Care  Patient       Patient will benefit from skilled therapeutic intervention in order to improve the following deficits and impairments:  Decreased strength, Decreased coordination, Decreased activity tolerance, Decreased knowledge of precautions, Decreased range of motion  Visit Diagnosis: 1. Muscle weakness (generalized)   2. Other muscle spasm   3. Unspecified lack of coordination        Problem List Patient Active Problem List    Diagnosis Date Noted  . Malignant neoplasm of prostate ([place]) 05/09/2018    Jule Ser, PT 12/05/2018, 6:34 PM  [facility] [address], [place], [place], [address] Phone: [phone]   Fax:  [phone]  Name: Blake Maxwell MRN: [id number] Date of Birth: Jun 19, 1970  PHYSICAL THERAPY DISCHARGE SUMMARY  Visits from Start of Care: 3  Current functional level related to goals / functional outcomes: See above details   Remaining deficits: See above   Education / Equipment: HEP  Plan: Patient agrees to discharge.  Patient goals were partially met. Patient is being discharged due to not returning since the last visit.  ?????     [employer], PT 04/11/19 3:56 PM

## 2018-12-12 ENCOUNTER — Encounter: Payer: Managed Care, Other (non HMO) | Admitting: Physical Therapy

## 2018-12-19 NOTE — Patient Instructions (Addendum)
YOU NEED TO HAVE A COVID 19 TEST ON 12-21-18  @ 3:30 PM, THIS TEST MUST BE DONE BEFORE SURGERY, COME TO [place] [place] ENTRANCE. ONCE YOUR COVID TEST IS COMPLETED, PLEASE BEGIN THE QUARANTINE INSTRUCTIONS AS OUTLINED IN YOUR HANDOUT.                Blake Maxwell    Your procedure is scheduled on: 12-25-2018   Report to [facility] Main  Entrance    Report to admitting at 9:15 AM    Call this number if you have problems the morning of surgery [phone]      FOLLOW ALL BOWEL PREP INSTRUCTIONS FROM DR Alinda Money     Take these medicines the morning of surgery with A SIP OF WATER: None   BRUSH YOUR TEETH MORNING OF SURGERY AND RINSE YOUR MOUTH OUT, NO CHEWING GUM CANDY OR MINTS.                               You may not have any metal on your body including hair pins and              piercings     Do not wear jewelry, cologne,  lotions, powders or deodorant                        Men may shave face and neck.   Do not bring valuables to the hospital. [place].  Contacts, dentures or bridgework may not be worn into surgery.       _____________________________________________________________________             [facility] - Preparing for Surgery Before surgery, you can play an important role.  Because skin is not sterile, your skin needs to be as free of germs as possible.  You can reduce the number of germs on your skin by washing with CHG (chlorahexidine gluconate) soap before surgery.  CHG is an antiseptic cleaner which kills germs and bonds with the skin to continue killing germs even after washing. Please DO NOT use if you have an allergy to CHG or antibacterial soaps.  If your skin becomes reddened/irritated stop using the CHG and inform your nurse when you arrive at Short Stay. Do not shave (including legs and underarms) for at least 48 hours prior to the first CHG shower.  You may shave your  face/neck. Please follow these instructions carefully:  1.  Shower with CHG Soap the night before surgery and the  morning of Surgery.  2.  If you choose to wash your hair, wash your hair first as usual with your  normal  shampoo.  3.  After you shampoo, rinse your hair and body thoroughly to remove the  shampoo.                           4.  Use CHG as you would any other liquid soap.  You can apply chg directly  to the skin and wash                       Gently with a scrungie or clean washcloth.  5.  Apply the CHG Soap to your body ONLY FROM THE NECK DOWN.  Do not use on face/ open                           Wound or open sores. Avoid contact with eyes, ears mouth and genitals (private parts).                       Wash face,  Genitals (private parts) with your normal soap.             6.  Wash thoroughly, paying special attention to the area where your surgery  will be performed.  7.  Thoroughly rinse your body with warm water from the neck down.  8.  DO NOT shower/wash with your normal soap after using and rinsing off  the CHG Soap.                9.  Pat yourself dry with a clean towel.            10.  Wear clean pajamas.            11.  Place clean sheets on your bed the night of your first shower and do not  sleep with pets. Day of Surgery : Do not apply any lotions/deodorants the morning of surgery.  Please wear clean clothes to the hospital/surgery center.  FAILURE TO FOLLOW THESE INSTRUCTIONS MAY RESULT IN THE CANCELLATION OF YOUR SURGERY PATIENT SIGNATURE_________________________________  NURSE SIGNATURE__________________________________  ________________________________________________________________________   [name]  An incentive spirometer is a tool that can help keep your lungs clear and active. This tool measures how well you are filling your lungs with each breath. Taking long deep breaths may help reverse or decrease the chance of developing breathing  (pulmonary) problems (especially infection) following:  A long period of time when you are unable to move or be active. BEFORE THE PROCEDURE   If the spirometer includes an indicator to show your best effort, your nurse or respiratory therapist will set it to a desired goal.  If possible, sit up straight or lean slightly forward. Try not to slouch.  Hold the incentive spirometer in an upright position. INSTRUCTIONS FOR USE  1. Sit on the edge of your bed if possible, or sit up as far as you can in bed or on a chair. 2. Hold the incentive spirometer in an upright position. 3. Breathe out normally. 4. Place the mouthpiece in your mouth and seal your lips tightly around it. 5. Breathe in slowly and as deeply as possible, raising the piston or the ball toward the top of the column. 6. Hold your breath for 3-5 seconds or for as long as possible. Allow the piston or ball to fall to the bottom of the column. 7. Remove the mouthpiece from your mouth and breathe out normally. 8. Rest for a few seconds and repeat Steps 1 through 7 at least 10 times every 1-2 hours when you are awake. Take your time and take a few normal breaths between deep breaths. 9. The spirometer may include an indicator to show your best effort. Use the indicator as a goal to work toward during each repetition. 10. After each set of 10 deep breaths, practice coughing to be sure your lungs are clear. If you have an incision (the cut made at the time of surgery), support your incision when coughing by placing a pillow or rolled up towels firmly against it. Once you are able to get out of  bed, walk around indoors and cough well. You may stop using the incentive spirometer when instructed by your caregiver.  RISKS AND COMPLICATIONS  Take your time so you do not get dizzy or light-headed.  If you are in pain, you may need to take or ask for pain medication before doing incentive spirometry. It is harder to take a deep breath if you  are having pain. AFTER USE  Rest and breathe slowly and easily.  It can be helpful to keep track of a log of your progress. Your caregiver can provide you with a simple table to help with this. If you are using the spirometer at home, follow these instructions: [place] IF:   You are having difficultly using the spirometer.  You have trouble using the spirometer as often as instructed.  Your pain medication is not giving enough relief while using the spirometer.  You develop fever of 100.5 F (38.1 C) or higher. SEEK IMMEDIATE MEDICAL CARE IF:   You cough up bloody sputum that had not been present before.  You develop fever of 102 F (38.9 C) or greater.  You develop worsening pain at or near the incision site. MAKE SURE YOU:   Understand these instructions.  Will watch your condition.  Will get help right away if you are not doing well or get worse. Document Released: 10/11/2006 Document Revised: 08/23/2011 Document Reviewed: 12/12/2006 ExitCare Patient Information 2014 ExitCare, [place].   ________________________________________________________________________  WHAT IS A BLOOD TRANSFUSION? Blood Transfusion Information  A transfusion is the replacement of blood or some of its parts. Blood is made up of multiple cells which provide different functions.  Red blood cells carry oxygen and are used for blood loss replacement.  White blood cells fight against infection.  Platelets control bleeding.  Plasma helps clot blood.  Other blood products are available for specialized needs, such as hemophilia or other clotting disorders. BEFORE THE TRANSFUSION  Who gives blood for transfusions?   Healthy volunteers who are fully evaluated to make sure their blood is safe. This is blood bank blood. Transfusion therapy is the safest it has ever been in the practice of medicine. Before blood is taken from a donor, a complete history is taken to make sure that person has  no history of diseases nor engages in risky social behavior (examples are intravenous drug use or sexual activity with multiple partners). The donor's travel history is screened to minimize risk of transmitting infections, such as malaria. The donated blood is tested for signs of infectious diseases, such as HIV and hepatitis. The blood is then tested to be sure it is compatible with you in order to minimize the chance of a transfusion reaction. If you or a relative donates blood, this is often done in anticipation of surgery and is not appropriate for emergency situations. It takes many days to process the donated blood. RISKS AND COMPLICATIONS Although transfusion therapy is very safe and saves many lives, the main dangers of transfusion include:   Getting an infectious disease.  Developing a transfusion reaction. This is an allergic reaction to something in the blood you were given. Every precaution is taken to prevent this. The decision to have a blood transfusion has been considered carefully by your caregiver before blood is given. Blood is not given unless the benefits outweigh the risks. AFTER THE TRANSFUSION  Right after receiving a blood transfusion, you will usually feel much better and more energetic. This is especially true if your red blood  cells have gotten low (anemic). The transfusion raises the level of the red blood cells which carry oxygen, and this usually causes an energy increase.  The nurse administering the transfusion will monitor you carefully for complications. HOME CARE INSTRUCTIONS  No special instructions are needed after a transfusion. You may find your energy is better. Speak with your caregiver about any limitations on activity for underlying diseases you may have. SEEK MEDICAL CARE IF:   Your condition is not improving after your transfusion.  You develop redness or irritation at the intravenous (IV) site. SEEK IMMEDIATE MEDICAL CARE IF:  Any of the following  symptoms occur over the next 12 hours:  Shaking chills.  You have a temperature by mouth above 102 F (38.9 C), not controlled by medicine.  Chest, back, or muscle pain.  People around you feel you are not acting correctly or are confused.  Shortness of breath or difficulty breathing.  Dizziness and fainting.  You get a rash or develop hives.  You have a decrease in urine output.  Your urine turns a dark color or changes to pink, red, or brown. Any of the following symptoms occur over the next 10 days:  You have a temperature by mouth above 102 F (38.9 C), not controlled by medicine.  Shortness of breath.  Weakness after normal activity.  The white part of the eye turns yellow (jaundice).  You have a decrease in the amount of urine or are urinating less often.  Your urine turns a dark color or changes to pink, red, or brown. Document Released: 05/28/2000 Document Revised: 08/23/2011 Document Reviewed: 01/15/2008 [facility] Patient Information 2014 [place], [place].  _______________________________________________________________________

## 2018-12-20 ENCOUNTER — Encounter (HOSPITAL_COMMUNITY)
Admission: RE | Admit: 2018-12-20 | Discharge: 2018-12-20 | Disposition: A | Discharge status: Home / Self Care | Payer: Managed Care, Other (non HMO) | Source: Ambulatory Visit | Attending: Urology | Admitting: Urology

## 2018-12-20 ENCOUNTER — Encounter (HOSPITAL_COMMUNITY): Payer: Self-pay

## 2018-12-20 ENCOUNTER — Other Ambulatory Visit: Payer: Self-pay

## 2018-12-20 DIAGNOSIS — C61 Malignant neoplasm of prostate: Secondary | ICD-10-CM | POA: Insufficient documentation

## 2018-12-20 DIAGNOSIS — Z1159 Encounter for screening for other viral diseases: Secondary | ICD-10-CM | POA: Insufficient documentation

## 2018-12-20 DIAGNOSIS — Z01812 Encounter for preprocedural laboratory examination: Secondary | ICD-10-CM | POA: Diagnosis present

## 2018-12-20 LAB — CBC
HCT: 39.8 % (ref 39.0–52.0)
Hemoglobin: 13.1 g/dL (ref 13.0–17.0)
MCH: 29.9 pg (ref 26.0–34.0)
MCHC: 32.9 g/dL (ref 30.0–36.0)
MCV: 90.9 fL (ref 80.0–100.0)
Platelets: 183 10*3/uL (ref 150–400)
RBC: 4.38 MIL/uL (ref 4.22–5.81)
RDW: 12.1 % (ref 11.5–15.5)
WBC: 3.2 10*3/uL — ABNORMAL LOW (ref 4.0–10.5)
nRBC: 0 % (ref 0.0–0.2)

## 2018-12-20 LAB — BASIC METABOLIC PANEL
Anion gap: 8 (ref 5–15)
BUN: 20 mg/dL (ref 6–20)
CO2: 23 mmol/L (ref 22–32)
Calcium: 9.4 mg/dL (ref 8.9–10.3)
Chloride: 109 mmol/L (ref 98–111)
Creatinine, Ser: 0.99 mg/dL (ref 0.61–1.24)
GFR calc Af Amer: >60 mL/min (ref >60)
GFR calc non Af Amer: >60 mL/min (ref >60)
Glucose, Bld: 93 mg/dL (ref 70–99)
Potassium: 4.1 mmol/L (ref 3.5–5.1)
Sodium: 140 mmol/L (ref 135–145)

## 2018-12-20 LAB — ABO/RH: ABO/RH(D): O POS

## 2018-12-21 ENCOUNTER — Other Ambulatory Visit (HOSPITAL_COMMUNITY)
Admission: RE | Admit: 2018-12-21 | Discharge: 2018-12-21 | Disposition: A | Discharge status: Home / Self Care | Payer: Managed Care, Other (non HMO) | Source: Ambulatory Visit | Attending: Urology | Admitting: Urology

## 2018-12-21 DIAGNOSIS — Z01812 Encounter for preprocedural laboratory examination: Secondary | ICD-10-CM | POA: Diagnosis not present

## 2018-12-22 LAB — SARS CORONAVIRUS 2 (TAT 6-24 HRS): SARS Coronavirus 2: NEGATIVE

## 2018-12-22 NOTE — Progress Notes (Signed)
Pt aware to come in at 0530 instead of 0915 on Monday, 12/22/2018 for procedure.

## 2018-12-22 NOTE — H&P (Signed)
Office Visit Report     12/06/2018   --------------------------------------------------------------------------------   Blake Maxwell  MRN: [id number]  DOB: Jul 19, 1970, 48 year old Male  SSN: [id number]   PRIMARY CARE:    REFERRING:  Blake Maxwell, M  PROVIDER:  Festus Maxwell, M.D.  TREATING:  Blake Maxwell, M.D.  LOCATION:  [facility] [phone]     --------------------------------------------------------------------------------   CC/HPI: CC: Prostate Cancer   PCP: Dr. Emelia Maxwell   Mr. 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 He enrolled on the PROTEUS trial and has received 6 months of androgen deprivation + placebo/apalutamide. He has tolerated therapy relatively well. He has gained approximately 10-15 lb and does have hot flashes that have been tolerable. His energy level has remained relatively stable. He presents today in anticipation of proceeding with his radical prostatectomy in the next few weeks. He denies any changes in his overall health since his last visit.   Family history: None.   Imaging studies:  CT abd/pelvis (04/17/18): No lymphadenopathy or sclerotic lesions. Two non-obstructing left renal calculi (largest 5 mm).  Bone scan (04/17/18): Negative for metastatic disease. Uptake at shoulders, sternoclavicular joints, hips, and knees consistent with degenerative changes.   PMH: No medical comorbidities.  PSH: No abdominal surgeries.   TNM stage: cT2b N0 M0  PSA: 56.3  Gleason score: 4+5=9  Biopsy (03/28/18): 12/12 cores positive  Left: L lateral apex (5%, 3+4=7, PNI), L apex (95%, 4+3=7), L lateral mid (20%, 3+4=7, PNI), L mid (95%, 4+3=7, PNI), L lateral base (70%, 4+5=9), L base (60%, 4+3=7, PNI)  Right: R apex (90%, 4+3=7), R  lateral apex (80%, 4+3=7), R mid (90%, 4+3=7, PNI), R lateral mid (80%, 3+4=7), R base (90%, 3+4=7), R lateral base (60%, 4+3=7)  Prostate volume: 43.2 cc   Nomogram  OC disease: 2%  EPE: 98%  SVI: 85%  LNI: 82%  PFS (5 year, 10 year): 3%, 2%   Urinary function: IPSS is 1.  Erectile function: SHIM score is 25.     ALLERGIES: No Allergies    MEDICATIONS: Levaquin 750 mg tablet 1 tablet PO morning of procedure  Multiple Vitamins     GU PSH: Locm 300-399Mg /Ml Iodine,1Ml - 04/17/2018 Prostate Needle Biopsy - 03/28/2018 Ureteroscopic laser litho - 2016       PSH Notes: Cystoscopy Ureteroscopy Lithotripsy Incl Insert Indwelling Ureter Stent, No Surgical Problems   NON-GU PSH: Surgical Pathology, Gross And Microscopic Examination For Prostate Needle - 03/28/2018     GU PMH: Prostate Cancer - 04/18/2018 Elevated PSA - 03/28/2018, - 03/23/2018 Ureteral calculus, Right ureteral stone - 2017 Renal calculus, Nephrolithiasis - 2016      PMH Notes:  2015-05-19 10:37:35 - Note: No pertinent past medical history   NON-GU PMH: None   FAMILY HISTORY: 2 daughters - No Family History Diabetes - Father Hypertension - Father No pertinent family history - Runs In Family   SOCIAL HISTORY: Marital Status: Married Preferred Language: English; Race: Black or African American Current Smoking Status: Patient has never smoked.   Tobacco Use Assessment Completed: Used Tobacco in last 30 days? Does not use smokeless tobacco. Has never drank.  Does not drink caffeine. Has not had a blood transfusion.     Notes: Married, Caffeine use, Occupation,  Never a smoker, Number of children, Alcohol use   REVIEW OF SYSTEMS:    GU Review Male:   Patient denies frequent urination, hard to postpone urination, burning/ pain with urination, get up at night to urinate, leakage of urine, stream starts and stops, trouble starting your streams, and have to strain to urinate .  Gastrointestinal (Lower):    Patient denies diarrhea and constipation.  Gastrointestinal (Upper):   Patient denies nausea and vomiting.  Constitutional:   Patient denies fever, night sweats, weight loss, and fatigue.  Skin:   Patient denies skin rash/ lesion and itching.  Eyes:   Patient denies blurred vision and double vision.  Ears/ Nose/ Throat:   Patient denies sore throat and sinus problems.  Hematologic/Lymphatic:   Patient denies swollen glands and easy bruising.  Cardiovascular:   Patient denies leg swelling and chest pains.  Respiratory:   Patient denies cough and shortness of breath.  Endocrine:   Patient denies excessive thirst.  Musculoskeletal:   Patient denies back pain and joint pain.  Neurological:   Patient denies headaches and dizziness.  Psychologic:   Patient denies depression and anxiety.   VITAL SIGNS:      12/06/2018 01:04 PM  Weight 206 lb / 93.44 kg  Height 69 in / 175.26 cm  BP 112/67 mmHg  Pulse 64 /min  BMI 30.4 kg/m   MULTI-SYSTEM PHYSICAL EXAMINATION:    Constitutional: Well-nourished. No physical deformities. Normally developed. Good grooming.  Neck: Neck symmetrical, not swollen. Normal tracheal position.  Respiratory: No labored breathing, no use of accessory muscles. Clear bilaterally.  Cardiovascular: Normal temperature, normal extremity pulses, no swelling, no varicosities. Regular rate and rhythm.  Lymphatic: No enlargement of neck, axillae, groin.  Skin: No paleness, no jaundice, no cyanosis. No lesion, no ulcer, no rash.  Neurologic / Psychiatric: Oriented to time, oriented to place, oriented to person. No depression, no anxiety, no agitation.  Gastrointestinal: No mass, no tenderness, no rigidity,  Eyes: Normal conjunctivae. Normal eyelids.  Ears, Nose, Mouth, and Throat: Left ear no scars, no lesions, no masses. Right ear no scars, no lesions, no masses. Nose no scars, no lesions, no masses. Normal hearing. Normal lips.  Musculoskeletal: Normal gait and station of head  and neck.     PAST DATA REVIEWED:  Source Of History:  Patient  Lab Test Review:   PSA  Records Review:   Pathology Reports  Urine Test Review:   Urinalysis   03/23/18  PSA  Total PSA 56.30 ng/mL    11/29/18  Hormones  Testosterone, Total 18.2 ng/dL    PROCEDURES:          Urinalysis w/Scope Dipstick Dipstick Cont'd Micro  Color: Yellow Bilirubin: Neg mg/dL WBC/hpf: NS (Not Seen)  Appearance: Cloudy Ketones: Neg mg/dL RBC/hpf: NS (Not Seen)  Specific Gravity: 1.020 Blood: Neg ery/uL Bacteria: NS (Not Seen)  pH: 7.0 Protein: Neg mg/dL Cystals: Amorph Phosphates  Glucose: Neg mg/dL Urobilinogen: 0.2 mg/dL Casts: NS (Not Seen)    Nitrites: Neg Trichomonas: Not Present    Leukocyte Esterase: Neg leu/uL Mucous: Not Present      Epithelial Cells: NS (Not Seen)      Yeast: NS (Not Seen)      Sperm: Not Present    ASSESSMENT:      ICD-10 Details  1 GU:   Prostate Cancer - C61    PLAN:           Schedule Return Visit/Planned Activity: Keep Scheduled Appointment  Document Letter(s):  Created for Patient: Clinical Summary         Notes:   1. High-risk prostate cancer: He received Lupron 45 mg today per protocol on the PROTEUS trial. He will plan to stop his oral medication the day before surgery and this will then be restarted 1-2 weeks postoperatively. We discussed surgical therapy for prostate cancer including the different available surgical approaches. We discussed, in detail, the risks and expectations of surgery with regard to cancer control, urinary control, and erectile function as well as the expected postoperative recovery process. Additional risks of surgery including but not limited to bleeding, infection, hernia formation, nerve damage, lymphocele formation, bowel/rectal injury potentially necessitating colostomy, damage to the urinary tract resulting in urine leakage, urethral stricture, and the cardiopulmonary risks such as myocardial infarction, stroke,  death, venothromboembolism, etc. were explained. The risk of open surgical conversion for robotic/laparoscopic prostatectomy was also discussed.   We reviewed the preoperative and postoperative expectations around the time of surgery. We reviewed expectations related to his cancer treatment, urinary function, and erectile function. All questions were answered to he and his wife's stated satisfaction. He is scheduled to proceed with a non nerve-sparing robot assisted laparoscopic radical prostatectomy and extended bilateral pelvic lymphadenectomy. This is scheduled for July 13th.   Cc: Dr. Emelia Maxwell         Next Appointment:      Next Appointment: 12/25/2018 11:30 AM    Appointment Type: Surgery     Location: [facility] [phone]    Provider: Raynelle Maxwell, M.D.    Reason for Visit: WL/EXT REC RA LAP RAD PROSTATECTOMY LEVEL 3, BPLA WITH AMANDA      E & M CODE: I spent at least 25 minutes face to face with the patient, more than 50% of that time was spent on counseling and/or coordinating care.     * Signed by Blake Maxwell, M.D. on 12/06/18 at 9:03 PM (EDT)*

## 2018-12-24 NOTE — Anesthesia Preprocedure Evaluation (Addendum)
Anesthesia Evaluation  Patient identified by MRN, date of birth, ID band Patient awake    Reviewed: Allergy & Precautions, NPO status , Patient's Chart, lab work & pertinent test results  Airway Mallampati: II  TM Distance: >3 FB Neck ROM: Full    Dental  (+) Teeth Intact, Dental Advisory Given, Chipped,    Pulmonary neg pulmonary ROS,    Pulmonary exam normal breath sounds clear to auscultation       Cardiovascular negative cardio ROS Normal cardiovascular exam Rhythm:Regular Rate:Normal     Neuro/Psych negative neurological ROS     GI/Hepatic negative GI ROS, Neg liver ROS,   Endo/Other  Obesity   Renal/GU Renal InsufficiencyRenal disease   PROSTATE CANCER    Musculoskeletal negative musculoskeletal ROS (+)   Abdominal   Peds  Hematology negative hematology ROS (+)   Anesthesia Other Findings Day of surgery medications reviewed with the patient.  Reproductive/Obstetrics                            Anesthesia Physical Anesthesia Plan  ASA: III  Anesthesia Plan: General   Post-op Pain Management:    Induction: Intravenous  PONV Risk Score and Plan: 4 or greater and Diphenhydramine, Scopolamine patch - Pre-op, Midazolam, Dexamethasone and Ondansetron  Airway Management Planned: Oral ETT  Additional Equipment:   Intra-op Plan:   Post-operative Plan: Extubation in OR  Informed Consent: I have reviewed the patients History and Physical, chart, labs and discussed the procedure including the risks, benefits and alternatives for the proposed anesthesia with the patient or authorized representative who has indicated his/her understanding and acceptance.     Dental advisory given  Plan Discussed with: CRNA  Anesthesia Plan Comments: (2 Large Bore PIVs.)       Anesthesia Quick Evaluation

## 2018-12-25 ENCOUNTER — Encounter (HOSPITAL_COMMUNITY): Payer: Self-pay | Admitting: Emergency Medicine

## 2018-12-25 ENCOUNTER — Encounter (HOSPITAL_COMMUNITY)
Admission: RE | Disposition: A | Discharge status: Home / Self Care | Payer: Self-pay | Source: Home / Self Care | Attending: Urology

## 2018-12-25 ENCOUNTER — Ambulatory Visit (HOSPITAL_COMMUNITY): Payer: Managed Care, Other (non HMO) | Admitting: Anesthesiology

## 2018-12-25 ENCOUNTER — Encounter: Payer: Self-pay | Admitting: Medical Oncology

## 2018-12-25 ENCOUNTER — Ambulatory Visit (HOSPITAL_COMMUNITY): Payer: Managed Care, Other (non HMO) | Admitting: Physician Assistant

## 2018-12-25 ENCOUNTER — Observation Stay (HOSPITAL_COMMUNITY)
Admission: RE | Admit: 2018-12-25 | Discharge: 2018-12-26 | Disposition: A | Discharge status: Home / Self Care | Payer: Managed Care, Other (non HMO) | Attending: Urology | Admitting: Urology

## 2018-12-25 ENCOUNTER — Other Ambulatory Visit: Payer: Self-pay

## 2018-12-25 DIAGNOSIS — N189 Chronic kidney disease, unspecified: Secondary | ICD-10-CM | POA: Insufficient documentation

## 2018-12-25 DIAGNOSIS — Z79899 Other long term (current) drug therapy: Secondary | ICD-10-CM | POA: Insufficient documentation

## 2018-12-25 DIAGNOSIS — C61 Malignant neoplasm of prostate: Principal | ICD-10-CM | POA: Insufficient documentation

## 2018-12-25 DIAGNOSIS — C775 Secondary and unspecified malignant neoplasm of intrapelvic lymph nodes: Secondary | ICD-10-CM | POA: Diagnosis not present

## 2018-12-25 HISTORY — PX: LYMPHADENECTOMY: SHX5960

## 2018-12-25 HISTORY — PX: ROBOT ASSISTED LAPAROSCOPIC RADICAL PROSTATECTOMY: SHX5141

## 2018-12-25 LAB — TYPE AND SCREEN
ABO/RH(D): O POS
Antibody Screen: NEGATIVE

## 2018-12-25 LAB — HEMOGLOBIN AND HEMATOCRIT, BLOOD
HCT: 42 % (ref 39.0–52.0)
Hemoglobin: 14 g/dL (ref 13.0–17.0)

## 2018-12-25 SURGERY — XI ROBOTIC ASSISTED LAPAROSCOPIC RADICAL PROSTATECTOMY LEVEL 3
Anesthesia: General

## 2018-12-25 MED ORDER — EPHEDRINE SULFATE-NACL 50-0.9 MG/10ML-% IV SOSY
PREFILLED_SYRINGE | INTRAVENOUS | Status: DC | PRN
  Administered 2018-12-25: 10 mg via INTRAVENOUS

## 2018-12-25 MED ORDER — DOCUSATE SODIUM 100 MG PO CAPS
100.0000 mg | ORAL_CAPSULE | Freq: Two times a day (BID) | ORAL | Status: DC
  Administered 2018-12-25 – 2018-12-26 (×2): 100 mg via ORAL
  Filled 2018-12-25 (×2): qty 1

## 2018-12-25 MED ORDER — BELLADONNA ALKALOIDS-OPIUM 16.2-60 MG RE SUPP
1.0000 | Freq: Four times a day (QID) | RECTAL | Status: DC | PRN
  Administered 2018-12-25: 12:00:00 1 via RECTAL

## 2018-12-25 MED ORDER — FENTANYL CITRATE (PF) 250 MCG/5ML IJ SOLN
INTRAMUSCULAR | Status: AC
  Filled 2018-12-25: qty 5

## 2018-12-25 MED ORDER — LIDOCAINE 2% (20 MG/ML) 5 ML SYRINGE
INTRAMUSCULAR | Status: DC | PRN
  Administered 2018-12-25: 100 mg via INTRAVENOUS

## 2018-12-25 MED ORDER — GLYCOPYRROLATE PF 0.2 MG/ML IJ SOSY
PREFILLED_SYRINGE | INTRAMUSCULAR | Status: AC
  Filled 2018-12-25: qty 1

## 2018-12-25 MED ORDER — BELLADONNA ALKALOIDS-OPIUM 16.2-60 MG RE SUPP
RECTAL | Status: AC
  Administered 2018-12-25: 17:00:00
  Filled 2018-12-25: qty 1

## 2018-12-25 MED ORDER — DIPHENHYDRAMINE HCL 50 MG/ML IJ SOLN: 12.5000 mg | Freq: Four times a day (QID) | INTRAMUSCULAR | Status: DC | PRN

## 2018-12-25 MED ORDER — ACETAMINOPHEN 325 MG PO TABS: 650.0000 mg | ORAL_TABLET | ORAL | Status: DC | PRN

## 2018-12-25 MED ORDER — LIDOCAINE 2% (20 MG/ML) 5 ML SYRINGE
INTRAMUSCULAR | Status: DC | PRN
  Administered 2018-12-25: 1 mg/kg/h via INTRAVENOUS

## 2018-12-25 MED ORDER — TRAMADOL HCL 50 MG PO TABS: 50.0000 mg | ORAL_TABLET | Freq: Four times a day (QID) | ORAL | 0 refills | Status: DC | PRN

## 2018-12-25 MED ORDER — ONDANSETRON HCL 4 MG/2ML IJ SOLN
INTRAMUSCULAR | Status: AC
  Filled 2018-12-25: qty 4

## 2018-12-25 MED ORDER — GLYCOPYRROLATE PF 0.2 MG/ML IJ SOSY
PREFILLED_SYRINGE | INTRAMUSCULAR | Status: DC | PRN
  Administered 2018-12-25: .2 mg via INTRAVENOUS

## 2018-12-25 MED ORDER — FENTANYL CITRATE (PF) 100 MCG/2ML IJ SOLN
INTRAMUSCULAR | Status: AC
  Administered 2018-12-25: 50 ug via INTRAVENOUS
  Filled 2018-12-25: qty 2

## 2018-12-25 MED ORDER — STERILE WATER FOR IRRIGATION IR SOLN
Status: DC | PRN
  Administered 2018-12-25: 1000 mL

## 2018-12-25 MED ORDER — EPHEDRINE 5 MG/ML INJ
INTRAVENOUS | Status: AC
  Filled 2018-12-25: qty 10

## 2018-12-25 MED ORDER — LACTATED RINGERS IV SOLN
INTRAVENOUS | Status: DC | PRN
  Administered 2018-12-25: 08:00:00 1000 mL

## 2018-12-25 MED ORDER — FLEET ENEMA 7-19 GM/118ML RE ENEM
1.0000 | ENEMA | Freq: Once | RECTAL | Status: DC
  Filled 2018-12-25: qty 1

## 2018-12-25 MED ORDER — MIDAZOLAM HCL 5 MG/5ML IJ SOLN
INTRAMUSCULAR | Status: DC | PRN
  Administered 2018-12-25: 2 mg via INTRAVENOUS

## 2018-12-25 MED ORDER — GABAPENTIN 300 MG PO CAPS
300.0000 mg | ORAL_CAPSULE | Freq: Once | ORAL | Status: AC
  Administered 2018-12-25: 300 mg via ORAL
  Filled 2018-12-25: qty 1

## 2018-12-25 MED ORDER — KCL IN DEXTROSE-NACL 20-5-0.45 MEQ/L-%-% IV SOLN
INTRAVENOUS | Status: DC
  Administered 2018-12-25 – 2018-12-26 (×2): via INTRAVENOUS
  Filled 2018-12-25 (×3): qty 1000

## 2018-12-25 MED ORDER — SUGAMMADEX SODIUM 200 MG/2ML IV SOLN
INTRAVENOUS | Status: DC | PRN
  Administered 2018-12-25: 200 mg via INTRAVENOUS

## 2018-12-25 MED ORDER — MAGNESIUM CITRATE PO SOLN: 1.0000 | Freq: Once | ORAL | Status: DC

## 2018-12-25 MED ORDER — ROCURONIUM BROMIDE 10 MG/ML (PF) SYRINGE
PREFILLED_SYRINGE | INTRAVENOUS | Status: AC
  Filled 2018-12-25: qty 20

## 2018-12-25 MED ORDER — PROPOFOL 10 MG/ML IV BOLUS
INTRAVENOUS | Status: DC | PRN
  Administered 2018-12-25: 200 mg via INTRAVENOUS

## 2018-12-25 MED ORDER — DEXAMETHASONE SODIUM PHOSPHATE 10 MG/ML IJ SOLN
INTRAMUSCULAR | Status: DC | PRN
  Administered 2018-12-25: 10 mg via INTRAVENOUS

## 2018-12-25 MED ORDER — BACITRACIN-NEOMYCIN-POLYMYXIN 400-5-5000 EX OINT: 1.0000 "application " | TOPICAL_OINTMENT | Freq: Three times a day (TID) | CUTANEOUS | Status: DC | PRN

## 2018-12-25 MED ORDER — MORPHINE SULFATE (PF) 4 MG/ML IV SOLN
INTRAVENOUS | Status: AC
  Administered 2018-12-25: 17:00:00
  Filled 2018-12-25: qty 1

## 2018-12-25 MED ORDER — LACTATED RINGERS IV SOLN
INTRAVENOUS | Status: DC
  Administered 2018-12-25: 06:00:00 via INTRAVENOUS

## 2018-12-25 MED ORDER — ACETAMINOPHEN 500 MG PO TABS
1000.0000 mg | ORAL_TABLET | Freq: Once | ORAL | Status: AC
  Administered 2018-12-25: 1000 mg via ORAL
  Filled 2018-12-25: qty 2

## 2018-12-25 MED ORDER — ZOLPIDEM TARTRATE 5 MG PO TABS: 5.0000 mg | ORAL_TABLET | Freq: Every evening | ORAL | Status: DC | PRN

## 2018-12-25 MED ORDER — PROPOFOL 10 MG/ML IV BOLUS
INTRAVENOUS | Status: AC
  Filled 2018-12-25: qty 40

## 2018-12-25 MED ORDER — BUPIVACAINE-EPINEPHRINE (PF) 0.25% -1:200000 IJ SOLN
INTRAMUSCULAR | Status: AC
  Filled 2018-12-25: qty 30

## 2018-12-25 MED ORDER — DIPHENHYDRAMINE HCL 50 MG/ML IJ SOLN
INTRAMUSCULAR | Status: AC
  Filled 2018-12-25: qty 1

## 2018-12-25 MED ORDER — FENTANYL CITRATE (PF) 100 MCG/2ML IJ SOLN
INTRAMUSCULAR | Status: DC | PRN
  Administered 2018-12-25: 50 ug via INTRAVENOUS
  Administered 2018-12-25: 100 ug via INTRAVENOUS
  Administered 2018-12-25 (×2): 50 ug via INTRAVENOUS

## 2018-12-25 MED ORDER — MORPHINE SULFATE (PF) 4 MG/ML IV SOLN
2.0000 mg | INTRAVENOUS | Status: DC | PRN
  Administered 2018-12-25 (×2): 2 mg via INTRAVENOUS
  Filled 2018-12-25: qty 1

## 2018-12-25 MED ORDER — CEFAZOLIN SODIUM-DEXTROSE 2-4 GM/100ML-% IV SOLN
2.0000 g | Freq: Once | INTRAVENOUS | Status: AC
  Administered 2018-12-25: 2 g via INTRAVENOUS
  Filled 2018-12-25: qty 100

## 2018-12-25 MED ORDER — ONDANSETRON HCL 4 MG/2ML IJ SOLN
INTRAMUSCULAR | Status: DC | PRN
  Administered 2018-12-25: 4 mg via INTRAVENOUS

## 2018-12-25 MED ORDER — SCOPOLAMINE 1 MG/3DAYS TD PT72
1.0000 | MEDICATED_PATCH | Freq: Once | TRANSDERMAL | Status: DC
  Administered 2018-12-25: 1.5 mg via TRANSDERMAL
  Filled 2018-12-25: qty 1

## 2018-12-25 MED ORDER — SODIUM CHLORIDE 0.9 % IV BOLUS
1000.0000 mL | Freq: Once | INTRAVENOUS | Status: AC
  Administered 2018-12-25: 1000 mL via INTRAVENOUS

## 2018-12-25 MED ORDER — ONDANSETRON HCL 4 MG/2ML IJ SOLN
4.0000 mg | INTRAMUSCULAR | Status: DC | PRN
  Administered 2018-12-25: 4 mg via INTRAVENOUS
  Filled 2018-12-25: qty 2

## 2018-12-25 MED ORDER — KETOROLAC TROMETHAMINE 15 MG/ML IJ SOLN
15.0000 mg | Freq: Four times a day (QID) | INTRAMUSCULAR | Status: DC
  Administered 2018-12-25 – 2018-12-26 (×3): 15 mg via INTRAVENOUS
  Filled 2018-12-25 (×4): qty 1

## 2018-12-25 MED ORDER — DIPHENHYDRAMINE HCL 50 MG/ML IJ SOLN
INTRAMUSCULAR | Status: DC | PRN
  Administered 2018-12-25: 12.5 mg via INTRAVENOUS

## 2018-12-25 MED ORDER — BUPIVACAINE-EPINEPHRINE 0.25% -1:200000 IJ SOLN
INTRAMUSCULAR | Status: DC | PRN
  Administered 2018-12-25: 30 mL

## 2018-12-25 MED ORDER — SULFAMETHOXAZOLE-TRIMETHOPRIM 800-160 MG PO TABS: 1.0000 | ORAL_TABLET | Freq: Two times a day (BID) | ORAL | 0 refills | Status: DC

## 2018-12-25 MED ORDER — ROCURONIUM BROMIDE 10 MG/ML (PF) SYRINGE
PREFILLED_SYRINGE | INTRAVENOUS | Status: DC | PRN
  Administered 2018-12-25: 10 mg via INTRAVENOUS
  Administered 2018-12-25: 20 mg via INTRAVENOUS
  Administered 2018-12-25: 10 mg via INTRAVENOUS
  Administered 2018-12-25: 60 mg via INTRAVENOUS
  Administered 2018-12-25: 10 mg via INTRAVENOUS

## 2018-12-25 MED ORDER — CEFAZOLIN SODIUM-DEXTROSE 1-4 GM/50ML-% IV SOLN
1.0000 g | Freq: Three times a day (TID) | INTRAVENOUS | Status: AC
  Administered 2018-12-25 – 2018-12-26 (×2): 1 g via INTRAVENOUS
  Filled 2018-12-25 (×2): qty 50

## 2018-12-25 MED ORDER — LACTATED RINGERS IV SOLN
INTRAVENOUS | Status: DC | PRN
  Administered 2018-12-25: 08:00:00 via INTRAVENOUS

## 2018-12-25 MED ORDER — DEXAMETHASONE SODIUM PHOSPHATE 10 MG/ML IJ SOLN
INTRAMUSCULAR | Status: AC
  Filled 2018-12-25: qty 2

## 2018-12-25 MED ORDER — FENTANYL CITRATE (PF) 100 MCG/2ML IJ SOLN
25.0000 ug | INTRAMUSCULAR | Status: DC | PRN
  Administered 2018-12-25 (×2): 50 ug via INTRAVENOUS

## 2018-12-25 MED ORDER — PROMETHAZINE HCL 25 MG/ML IJ SOLN: 6.2500 mg | INTRAMUSCULAR | Status: DC | PRN

## 2018-12-25 MED ORDER — KETAMINE HCL 10 MG/ML IJ SOLN
INTRAMUSCULAR | Status: AC
  Filled 2018-12-25: qty 1

## 2018-12-25 MED ORDER — LIDOCAINE 2% (20 MG/ML) 5 ML SYRINGE
INTRAMUSCULAR | Status: AC
  Filled 2018-12-25: qty 10

## 2018-12-25 MED ORDER — MIDAZOLAM HCL 2 MG/2ML IJ SOLN
INTRAMUSCULAR | Status: AC
  Filled 2018-12-25: qty 2

## 2018-12-25 MED ORDER — LIDOCAINE HCL 2 % IJ SOLN
INTRAMUSCULAR | Status: AC
  Filled 2018-12-25: qty 40

## 2018-12-25 MED ORDER — KETAMINE HCL 10 MG/ML IJ SOLN
INTRAMUSCULAR | Status: DC | PRN
  Administered 2018-12-25: 30 mg via INTRAVENOUS

## 2018-12-25 MED ORDER — SODIUM CHLORIDE 0.9 % IR SOLN
Status: DC | PRN
  Administered 2018-12-25: 1000 mL via INTRAVESICAL

## 2018-12-25 MED ORDER — HEPARIN SODIUM (PORCINE) 1000 UNIT/ML IJ SOLN
INTRAMUSCULAR | Status: AC
  Filled 2018-12-25: qty 1

## 2018-12-25 MED ORDER — DIPHENHYDRAMINE HCL 12.5 MG/5ML PO ELIX: 12.5000 mg | ORAL_SOLUTION | Freq: Four times a day (QID) | ORAL | Status: DC | PRN

## 2018-12-25 SURGICAL SUPPLY — 55 items
APPLICATOR COTTON TIP 6 STRL (MISCELLANEOUS) ×2 IMPLANT
APPLICATOR COTTON TIP 6IN STRL (MISCELLANEOUS) ×4
CATH FOLEY 2WAY SLVR 18FR 30CC (CATHETERS) ×4 IMPLANT
CATH ROBINSON RED A/P 16FR (CATHETERS) ×4 IMPLANT
CATH ROBINSON RED A/P 8FR (CATHETERS) ×4 IMPLANT
CATH TIEMANN FOLEY 18FR 5CC (CATHETERS) ×4 IMPLANT
CHLORAPREP W/TINT 26 (MISCELLANEOUS) ×4 IMPLANT
CLIP VESOLOCK LG 6/CT PURPLE (CLIP) ×8 IMPLANT
COVER SURGICAL LIGHT HANDLE (MISCELLANEOUS) ×4 IMPLANT
COVER TIP SHEARS 8 DVNC (MISCELLANEOUS) ×2 IMPLANT
COVER TIP SHEARS 8MM DA VINCI (MISCELLANEOUS) ×2
COVER WAND RF STERILE (DRAPES) IMPLANT
CUTTER ECHEON FLEX ENDO 45 340 (ENDOMECHANICALS) ×4 IMPLANT
DECANTER SPIKE VIAL GLASS SM (MISCELLANEOUS) ×4 IMPLANT
DERMABOND ADVANCED (GAUZE/BANDAGES/DRESSINGS) ×2
DERMABOND ADVANCED .7 DNX12 (GAUZE/BANDAGES/DRESSINGS) ×2 IMPLANT
DRAPE ARM DVNC X/XI (DISPOSABLE) ×8 IMPLANT
DRAPE COLUMN DVNC XI (DISPOSABLE) ×2 IMPLANT
DRAPE DA VINCI XI ARM (DISPOSABLE) ×8
DRAPE DA VINCI XI COLUMN (DISPOSABLE) ×2
DRAPE SURG IRRIG POUCH 19X23 (DRAPES) ×4 IMPLANT
DRSG TEGADERM 4X4.75 (GAUZE/BANDAGES/DRESSINGS) ×4 IMPLANT
ELECT PENCIL ROCKER SW 15FT (MISCELLANEOUS) ×4 IMPLANT
ELECT REM PT RETURN 15FT ADLT (MISCELLANEOUS) ×4 IMPLANT
GLOVE BIO SURGEON STRL SZ 6.5 (GLOVE) ×3 IMPLANT
GLOVE BIO SURGEONS STRL SZ 6.5 (GLOVE) ×1
GLOVE BIOGEL M STRL SZ7.5 (GLOVE) ×8 IMPLANT
GOWN STRL REUS W/TWL LRG LVL3 (GOWN DISPOSABLE) ×12 IMPLANT
HOLDER FOLEY CATH W/STRAP (MISCELLANEOUS) ×4 IMPLANT
IRRIG SUCT STRYKERFLOW 2 WTIP (MISCELLANEOUS) ×4
IRRIGATION SUCT STRKRFLW 2 WTP (MISCELLANEOUS) ×2 IMPLANT
IV LACTATED RINGERS 1000ML (IV SOLUTION) ×4 IMPLANT
KIT TURNOVER KIT A (KITS) IMPLANT
NDL SAFETY ECLIPSE 18X1.5 (NEEDLE) ×2 IMPLANT
NEEDLE HYPO 18GX1.5 SHARP (NEEDLE) ×2
PACK ROBOT UROLOGY CUSTOM (CUSTOM PROCEDURE TRAY) ×4 IMPLANT
SEAL CANN UNIV 5-8 DVNC XI (MISCELLANEOUS) ×8 IMPLANT
SEAL XI 5MM-8MM UNIVERSAL (MISCELLANEOUS) ×8
SOLUTION ELECTROLUBE (MISCELLANEOUS) ×4 IMPLANT
STAPLE RELOAD 45 GRN (STAPLE) ×2 IMPLANT
STAPLE RELOAD 45MM GREEN (STAPLE) ×2
SUT ETHILON 3 0 PS 1 (SUTURE) ×4 IMPLANT
SUT MNCRL 3 0 RB1 (SUTURE) ×2 IMPLANT
SUT MNCRL 3 0 VIOLET RB1 (SUTURE) ×2 IMPLANT
SUT MNCRL AB 4-0 PS2 18 (SUTURE) ×8 IMPLANT
SUT MONOCRYL 3 0 RB1 (SUTURE) ×4
SUT VIC AB 0 CT1 27 (SUTURE) ×2
SUT VIC AB 0 CT1 27XBRD ANTBC (SUTURE) ×2 IMPLANT
SUT VIC AB 0 UR5 27 (SUTURE) ×4 IMPLANT
SUT VIC AB 2-0 SH 27 (SUTURE) ×2
SUT VIC AB 2-0 SH 27X BRD (SUTURE) ×2 IMPLANT
SUT VICRYL 0 UR6 27IN ABS (SUTURE) ×8 IMPLANT
SYR 27GX1/2 1ML LL SAFETY (SYRINGE) ×4 IMPLANT
TOWEL OR NON WOVEN STRL DISP B (DISPOSABLE) ×4 IMPLANT
WATER STERILE IRR 1000ML POUR (IV SOLUTION) ×4 IMPLANT

## 2018-12-25 NOTE — Anesthesia Postprocedure Evaluation (Signed)
Anesthesia Post Note  Patient: Blake Maxwell  Procedure(s) Performed: XI ROBOTIC ASSISTED LAPAROSCOPIC RADICAL PROSTATECTOMY LEVEL 3 (N/A ) LYMPHADENECTOMY (Bilateral )     Patient location during evaluation: PACU Anesthesia Type: General Level of consciousness: awake and alert, awake and oriented Pain management: pain level controlled Vital Signs Assessment: post-procedure vital signs reviewed and stable Respiratory status: spontaneous breathing, nonlabored ventilation, respiratory function stable and patient connected to nasal cannula oxygen Cardiovascular status: blood pressure returned to baseline and stable Postop Assessment: no apparent nausea or vomiting Anesthetic complications: no    Last Vitals:  Vitals:   12/25/18 1330 12/25/18 1430  BP: 116/72 124/65  Pulse: 71 78  Resp: 16 16  Temp:    SpO2: 100% 100%    Last Pain:  Vitals:   12/25/18 1430  TempSrc:   PainSc: Asleep                 Catalina Gravel

## 2018-12-25 NOTE — Discharge Instructions (Signed)

## 2018-12-25 NOTE — Op Note (Signed)
Preoperative diagnosis: Clinically localized adenocarcinoma of the prostate (clinical stage T2b N0 M0)  Postoperative diagnosis: Clinically localized adenocarcinoma of the prostate (clinical stage T2b N0 M0)  Procedure:  1. Robotic assisted laparoscopic radical prostatectomy (Non nerve sparing) 2. Bilateral robotic assisted laparoscopic extended pelvic lymphadenectomy  Surgeon: Pryor Curia. M.D.  Assistant(s): Lattie Corns  An assistant was required for this surgical procedure.  The duties of the assistant included but were not limited to suctioning, passing suture, camera manipulation, retraction. This procedure would not be able to be performed without an Environmental consultant.  Resident: Dr. Tharon Aquas  Anesthesia: General  Complications: None  EBL: 50 mL  IVF:  1500 mL crystalloid  Specimens: 1. Prostate and seminal vesicles 2. Right pelvic lymph nodes 3. Left pelvic lymph nodes  Disposition of specimens: Pathology  Drains: 1. 20 Fr coude catheter 2. # 19 Blake pelvic drain  Indication: Blake Maxwell is a 48 y.o. patient with high risk clinically localized prostate cancer.  After a thorough review of the management options for treatment of prostate cancer, he elected to proceed with surgical therapy and the above procedure(s).  We have discussed the potential benefits and risks of the procedure, side effects of the proposed treatment, the likelihood of the patient achieving the goals of the procedure, and any potential problems that might occur during the procedure or recuperation. Informed consent has been obtained.  Description of procedure:  The patient was taken to the operating room and a general anesthetic was administered. He was given preoperative antibiotics, placed in the dorsal lithotomy position, and prepped and draped in the usual sterile fashion. Next a preoperative timeout was performed. A urethral catheter was placed into the bladder and a site was  selected near the umbilicus for placement of the camera port. This was placed using a standard open Hassan technique which allowed entry into the peritoneal cavity under direct vision and without difficulty. An 8 mm port was placed and a pneumoperitoneum established. The camera was then used to inspect the abdomen and there was no evidence of any intra-abdominal injuries or other abnormalities. The remaining abdominal ports were then placed. 8 mm robotic ports were placed in the right lower quadrant, left lower quadrant, and far left lateral abdominal wall. A 5 mm port was placed in the right upper quadrant and a 12 mm port was placed in the right lateral abdominal wall for laparoscopic assistance. All ports were placed under direct vision without difficulty. The surgical cart was then docked.   Utilizing the cautery scissors, the bladder was reflected posteriorly allowing entry into the space of Retzius and identification of the endopelvic fascia and prostate. The periprostatic fat was then removed from the prostate allowing full exposure of the endopelvic fascia. The endopelvic fascia was then incised from the apex back to the base of the prostate bilaterally and the underlying levator muscle fibers were swept laterally off the prostate thereby isolating the dorsal venous complex. The dorsal vein was then stapled and divided with a 45 mm Flex Echelon stapler. Attention then turned to the bladder neck which was divided anteriorly thereby allowing entry into the bladder and exposure of the urethral catheter. The catheter balloon was deflated and the catheter was brought into the operative field and used to retract the prostate anteriorly. The posterior bladder neck was then examined and was divided allowing further dissection between the bladder and prostate posteriorly until the vasa deferentia and seminal vessels were identified. The vasa deferentia were isolated,  divided, and lifted anteriorly. The seminal  vesicles were dissected down to their tips with care to control the seminal vascular arterial blood supply. These structures were then lifted anteriorly and the space between Denonvillier's fascia and the anterior rectum was developed with a combination of sharp and blunt dissection. This isolated the vascular pedicles of the prostate.   A wide non nerve sparing dissection was performed with Weck clips used to ligate the vascular pedicles of the prostate bilaterally. The vascular pedicles of the prostate were then divided.  The urethra was then sharply transected allowing the prostate specimen to be disarticulated. The pelvis was copiously irrigated and hemostasis was ensured. There was no evidence for rectal injury.  Attention then turned to the right pelvic sidewall. The fibrofatty tissue extending from the genitofemoral nerve laterally to the confluence of the iliac vessels proximally to the hypogastric artery posteriorly and Cooper's ligament distally was dissected free from the pelvic sidewall with care to preserve the obturator nerve and major vascular structures. Weck clips were used for lymphostasis and hemostasis. An identical procedure was then performed on the contralateral side and the lymphatic packets were removed for permanent pathologic analysis.  Attention then turned to the urethral anastomosis. A 2-0 Vicryl slip knot was placed between Denonvillier's fascia, the posterior bladder neck, and the posterior urethra to reapproximate these structures. A double-armed 3-0 Monocryl suture was then used to perform a 360 running tension-free anastomosis between the bladder neck and urethra. A new urethral catheter was then placed into the bladder and irrigated. There were no blood clots within the bladder and the anastomosis appeared to be watertight. A #19 Blake drain was then brought through the left lateral 8 mm port site and positioned appropriately within the pelvis. It was secured to the skin  with a nylon suture. The surgical cart was then undocked. The right lateral 12 mm port site was closed at the fascial level with a 0 Vicryl suture placed laparoscopically. All remaining ports were then removed under direct vision. The prostate specimen was removed intact within the Endopouch retrieval bag via the periumbilical camera port site. This fascial opening was closed with two running 0 Vicryl sutures. 0.25% Marcaine was then injected into all port sites and all incisions were reapproximated at the skin level with 4-0 Monocryl subcuticular sutures. Dermabond was applied. The patient appeared to tolerate the procedure well and without complications. The patient was able to be extubated and transferred to the recovery unit in satisfactory condition.  Pryor Curia MD

## 2018-12-25 NOTE — Plan of Care (Signed)

## 2018-12-25 NOTE — Interval H&P Note (Signed)
History and Physical Interval Note:  12/25/2018 6:49 AM  Blake Maxwell  has presented today for surgery, with the diagnosis of PROSTATE CANCER.  The various methods of treatment have been discussed with the patient and family. After consideration of risks, benefits and other options for treatment, the patient has consented to  Procedure(s) with comments: XI ROBOTIC ASSISTED LAPAROSCOPIC RADICAL PROSTATECTOMY LEVEL 3 (N/A) - NEEDS 210 MIN FOR ALL PROCEDURES LYMPHADENECTOMY (Bilateral) as a surgical intervention.  The patient's history has been reviewed, patient examined, no change in status, stable for surgery.  I have reviewed the patient's chart and labs.  Questions were answered to the patient's satisfaction.     [employer]

## 2018-12-25 NOTE — Transfer of Care (Signed)
Immediate Anesthesia Transfer of Care Note  Patient: Blake Maxwell  Procedure(s) Performed: XI ROBOTIC ASSISTED LAPAROSCOPIC RADICAL PROSTATECTOMY LEVEL 3 (N/A ) LYMPHADENECTOMY (Bilateral )  Patient Location: PACU  Anesthesia Type:General  Level of Consciousness: sedated  Airway & Oxygen Therapy: Patient Spontanous Breathing and Patient connected to face mask oxygen  Post-op Assessment: Report given to RN and Post -op Vital signs reviewed and stable  Post vital signs: Reviewed and stable  Last Vitals:  Vitals Value Taken Time  BP 115/62 12/25/18 1104  Temp    Pulse 73 12/25/18 1104  Resp 19 12/25/18 1105  SpO2 100 % 12/25/18 1104  Vitals shown include unvalidated device data.  Last Pain:  Vitals:   12/25/18 0556  TempSrc:   PainSc: 0-No pain      Patients Stated Pain Goal: 4 (28/36/62 [id number])  Complications: No apparent anesthesia complications

## 2018-12-25 NOTE — Anesthesia Procedure Notes (Signed)
Procedure Name: Intubation Date/Time: 12/25/2018 7:59 AM Performed by: Lavina Hamman, CRNA Pre-anesthesia Checklist: Patient identified, Emergency Drugs available, Suction available, Patient being monitored and Timeout performed Patient Re-evaluated:Patient Re-evaluated prior to induction Oxygen Delivery Method: Circle system utilized Preoxygenation: Pre-oxygenation with 100% oxygen Induction Type: IV induction Ventilation: Mask ventilation without difficulty Laryngoscope Size: Mac and 4 Grade View: Grade II Tube type: Oral Tube size: 7.5 mm Number of attempts: 1 Airway Equipment and Method: Stylet Placement Confirmation: ETT inserted through vocal cords under direct vision,  positive ETCO2,  CO2 detector and breath sounds checked- equal and bilateral Secured at: 23 cm Tube secured with: Tape Dental Injury: Teeth and Oropharynx as per pre-operative assessment

## 2018-12-25 NOTE — Progress Notes (Signed)
Post-op note  Subjective: The patient is doing well.  No complaints except mild bladder spasms  Objective: Vital signs in last 24 hours: Temp:  [97.4 F (36.3 C)-98.4 F (36.9 C)] 97.4 F (36.3 C) (07/13 1230) Pulse Rate:  [62-91] 78 (07/13 1430) Resp:  [12-19] 16 (07/13 1430) BP: (113-126)/(59-77) 124/65 (07/13 1430) SpO2:  [98 %-100 %] 100 % (07/13 1430) Weight:  [96 kg] 96 kg (07/13 0556)  Intake/Output from previous day: No intake/output data recorded. Intake/Output this shift: Total I/O In: 900 [I.V.:900] Out: 50 [Blood:50]  Physical Exam:  General: Alert and oriented. Abdomen: Soft, Nondistended. Incisions: Clean and dry. Urine: clear  Lab Results: Recent Labs    12/25/18 1113  HGB 14.0  HCT 42.0    Assessment/Plan: POD#0   1) Continue to monitor  2) DVT prophy, clears, IS, amb, pain control  3) B/O for spasms   LOS: 0 days   Debbrah Alar 12/25/2018, 3:17 PM

## 2018-12-26 ENCOUNTER — Encounter (HOSPITAL_COMMUNITY): Payer: Self-pay | Admitting: Urology

## 2018-12-26 DIAGNOSIS — C61 Malignant neoplasm of prostate: Secondary | ICD-10-CM | POA: Diagnosis not present

## 2018-12-26 LAB — HEMOGLOBIN AND HEMATOCRIT, BLOOD
HCT: 37.3 % — ABNORMAL LOW (ref 39.0–52.0)
Hemoglobin: 12.3 g/dL — ABNORMAL LOW (ref 13.0–17.0)

## 2018-12-26 MED ORDER — TRAMADOL HCL 50 MG PO TABS: 50.0000 mg | ORAL_TABLET | Freq: Four times a day (QID) | ORAL | Status: DC | PRN

## 2018-12-26 MED ORDER — BISACODYL 10 MG RE SUPP
10.0000 mg | Freq: Once | RECTAL | Status: AC
  Administered 2018-12-26: 10 mg via RECTAL
  Filled 2018-12-26: qty 1

## 2018-12-26 NOTE — Discharge Summary (Signed)
  Date of admission: 12/25/2018  Date of discharge: 12/26/2018  Admission diagnosis: Prostate Cancer  Discharge diagnosis: Prostate Cancer  History and Physical: For full details, please see admission history and physical. Briefly, Blake Maxwell is a 48 y.o. gentleman with localized prostate cancer.  After discussing management/treatment options, he elected to proceed with surgical treatment.  Hospital Course: Blake Maxwell was taken to the operating room on 12/25/2018 and underwent a robotic assisted laparoscopic radical prostatectomy. He tolerated this procedure well and without complications. Postoperatively, he was able to be transferred to a regular hospital room following recovery from anesthesia.  He was able to begin ambulating the night of surgery. He remained hemodynamically stable overnight.  He had excellent urine output with appropriately minimal output from his pelvic drain and his pelvic drain was removed on POD #1.  He was transitioned to oral pain medication, tolerated a clear liquid diet, and had met all discharge criteria and was able to be discharged home later on POD#1.  Laboratory values:  Recent Labs    12/25/18 1113 12/26/18 0559  HGB 14.0 12.3*  HCT 42.0 37.3*    Disposition: Home  Discharge instruction: He was instructed to be ambulatory but to refrain from heavy lifting, strenuous activity, or driving. He was instructed on urethral catheter care.  Discharge medications:   Allergies as of 12/26/2018   No Known Allergies     Medication List    STOP taking these medications   MULTIVITAMIN ADULT PO   tamsulosin 0.4 MG Caps capsule Commonly known as: FLOMAX     TAKE these medications   sulfamethoxazole-trimethoprim 800-160 MG tablet Commonly known as: BACTRIM DS Take 1 tablet by mouth 2 (two) times daily. Start the day prior to foley removal appointment   traMADol 50 MG tablet Commonly known as: Ultram Take 1-2 tablets (50-100 mg total) by mouth every 6  (six) hours as needed for moderate pain or severe pain.       Followup: He will followup in 1 week for catheter removal and to discuss his surgical pathology results.

## 2018-12-26 NOTE — Progress Notes (Addendum)
Post-op note  Subjective: The patient is doing well. Some nausea post op has improved. Tolerating PO. No flatus or BM.   Objective: Vital signs in last 24 hours: Temp:  [97.4 F (36.3 C)-98.4 F (36.9 C)] 97.5 F (36.4 C) (07/14 0348) Pulse Rate:  [53-91] 56 (07/14 0348) Resp:  [12-22] 20 (07/14 0348) BP: (105-134)/(54-77) 105/54 (07/14 0348) SpO2:  [97 %-100 %] 97 % (07/14 0348)  Intake/Output from previous day: 07/13 0701 - 07/14 0700 In: 3315.6 [P.O.:300; I.V.:2913.4; IV Piggyback:102.2] Out: 8185 [Urine:3350; Drains:145; Blood:50] Intake/Output this shift: No intake/output data recorded.  Physical Exam:  General: Alert and oriented. Abdomen: Soft, Nondistended. Incisions: Clean and dry. Urine: clear  Lab Results: Recent Labs    12/25/18 1113 12/26/18 0559  HGB 14.0 12.3*  HCT 42.0 37.3*    Assessment/Plan: POD#1  - d/c fluids - Continue clears - Suppository - Foley teaching - d/c JP - Ambulate this AM - Likely discharge around noon   LOS: 0 days   Tharon Aquas 12/26/2018, 7:16 AM   I have seen and examined the patient and agree with the above assessment and plan.

## 2018-12-26 NOTE — Plan of Care (Signed)
  Problem: Education: Goal: Knowledge of General Education information will improve Description: Including pain rating scale, medication(s)/side effects and non-pharmacologic comfort measures 12/26/2018 0408 by Ashley Murrain, RN Outcome: Progressing 12/26/2018 0408 by Ashley Murrain, RN Outcome: Progressing   Problem: Health Behavior/Discharge Planning: Goal: Ability to manage health-related needs will improve Outcome: Progressing   Problem: Clinical Measurements: Goal: Ability to maintain clinical measurements within normal limits will improve 12/26/2018 0408 by Ashley Murrain, RN Outcome: Progressing 12/26/2018 0408 by Ashley Murrain, RN Outcome: Progressing   Problem: Activity: Goal: Risk for activity intolerance will decrease Outcome: Progressing   Problem: Coping: Goal: Level of anxiety will decrease Outcome: Progressing   Problem: Elimination: Goal: Will not experience complications related to bowel motility Outcome: Progressing   Problem: Pain Managment: Goal: General experience of comfort will improve Outcome: Progressing

## 2019-01-04 ENCOUNTER — Encounter (HOSPITAL_COMMUNITY)
Admission: RE | Admit: 2019-01-04 | Discharge: 2019-01-04 | Disposition: A | Discharge status: Home / Self Care | Payer: Managed Care, Other (non HMO) | Source: Ambulatory Visit | Attending: Urology | Admitting: Urology

## 2019-01-04 ENCOUNTER — Other Ambulatory Visit: Payer: Self-pay

## 2019-01-04 DIAGNOSIS — C61 Malignant neoplasm of prostate: Secondary | ICD-10-CM | POA: Diagnosis not present

## 2019-01-04 MED ORDER — TECHNETIUM TC 99M MEDRONATE IV KIT
21.0000 | PACK | Freq: Once | INTRAVENOUS | Status: AC
  Administered 2019-01-04: 21 via INTRAVENOUS

## 2020-03-21 ENCOUNTER — Encounter: Payer: Self-pay | Admitting: Medical Oncology

## 2020-03-21 NOTE — Progress Notes (Signed)
I called pt to introduce myself as the Prostate Nurse Navigator and the Coordinator of the Prostate Blake Maxwell. I met him in 2017 in the [facility]  1. I confirmed with the patient he is aware of his referral to the clinic 04/01/20, arriving at 12:30.  2. I discussed the format of the clinic and the physicians he will be seeing that day.  3. I discussed where the clinic is located and how to contact me.  4. I confirmed his address and informed him I would be mailing a packet of information and forms to be completed. I asked him to bring them with him the day of his appointment.   He voiced understanding of the above. I asked him to call me if he has any questions or concerns regarding his appointments or the forms he needs to complete.

## 2020-03-25 ENCOUNTER — Encounter: Payer: Self-pay | Admitting: Medical Oncology

## 2020-03-28 ENCOUNTER — Encounter: Payer: Self-pay | Admitting: Genetic Counselor

## 2020-03-31 ENCOUNTER — Encounter: Payer: Self-pay | Admitting: Medical Oncology

## 2020-03-31 NOTE — Progress Notes (Signed)
Left message to remind patient of  appointment for [facility] 10/19, arriving @ 12:30 pm. I reviewed location, valet parking, registration and COVID protocol.I reminded him to bring completed medical forms and to have lunch prior to arrival. I asked if he would call back to confirm.

## 2020-03-31 NOTE — Progress Notes (Signed)
GU Location of Tumor / Histology: prostatic adenocarcinoma  If Prostate Cancer, Gleason Score is (4 + 5) and PSA is (56.3) pre treatment.  Blake Maxwell diagnosed with prostate cancer 04/18/2018. Enrolled in PROTEUS trial. Patient had prostatectomy 12/25/2018.   11/13/2019 PSA <0.015    Past/Anticipated interventions by urology, if any: prostate biopsy, enrolled in trial, ADT, prostatectomy (focal positive margin, 1/13 nodes positive), ADT, referral to [facility]  Past/Anticipated interventions by medical oncology, if any: no  Weight changes, if any: denies  Bowel/Bladder complaints, if any: IPSS 1. SHIM 6. Denies dysuria, hematuria, urinary leakage or incontinence. Denies any bowel complaints.    Nausea/Vomiting, if any: denies  Pain issues, if any:  denies  SAFETY ISSUES:  Prior radiation? denies  Pacemaker/ICD? denies  Possible current pregnancy? no, male patient  Is the patient on methotrexate? denies  Current Complaints / other details:  49 year old male. Married with 2 daughters.  Resides in [place]. Works as a Administrator for [employer].  Adjuvant radiation vs ongoing PSA monitoring

## 2020-04-01 ENCOUNTER — Encounter: Payer: Self-pay | Admitting: Radiation Oncology

## 2020-04-01 ENCOUNTER — Encounter: Payer: Self-pay | Admitting: Medical Oncology

## 2020-04-01 ENCOUNTER — Other Ambulatory Visit: Payer: Self-pay

## 2020-04-01 ENCOUNTER — Ambulatory Visit
Admission: RE | Admit: 2020-04-01 | Discharge: 2020-04-01 | Disposition: A | Discharge status: Home / Self Care | Payer: Managed Care, Other (non HMO) | Source: Ambulatory Visit | Attending: Radiation Oncology | Admitting: Radiation Oncology

## 2020-04-01 ENCOUNTER — Inpatient Hospital Stay: Payer: Managed Care, Other (non HMO) | Attending: Oncology | Admitting: Oncology

## 2020-04-01 DIAGNOSIS — C61 Malignant neoplasm of prostate: Secondary | ICD-10-CM

## 2020-04-01 DIAGNOSIS — N393 Stress incontinence (female) (male): Secondary | ICD-10-CM | POA: Insufficient documentation

## 2020-04-01 DIAGNOSIS — N529 Male erectile dysfunction, unspecified: Secondary | ICD-10-CM | POA: Insufficient documentation

## 2020-04-01 DIAGNOSIS — C775 Secondary and unspecified malignant neoplasm of intrapelvic lymph nodes: Secondary | ICD-10-CM | POA: Insufficient documentation

## 2020-04-01 DIAGNOSIS — Z87442 Personal history of urinary calculi: Secondary | ICD-10-CM | POA: Insufficient documentation

## 2020-04-01 NOTE — Progress Notes (Signed)
Radiation Oncology         [phone]) [phone] ________________________________  Multidisciplinary Prostate Cancer Clinic  Radiation Oncology Re-Consultation  Name: Blake Maxwell MRN: [id number]  Date: 04/01/2020  DOB: 12/28/1970  TD:HRCBULA, Sonia Side, NP  Raynelle Bring, MD   REFERRING PHYSICIAN: Raynelle Bring, MD  DIAGNOSIS: 49 y.o. gentleman with adverse surgical pathology s/p RALP in 12/2018 for stage ypT3b, ypN1 prostate cancer.    ICD-10-CM   1. Malignant neoplasm of prostate ([place])  C61     HISTORY OF PRESENT ILLNESS::Blake Maxwell is a 49 y.o. gentleman with prostate cancer.  We initially met the patient in the multidisciplinary prostate cancer conference in November 2019 to discuss treatment of Gleason 4+5 adenocarcinoma of the prostate with a PSA of 56 at the time of diagnosis.  He had high-volume, high risk disease involving 12 of 12 core biopsies performed on 03/28/2018.   CT A/P and bone scan imaging for disease staging were performed on 04/17/2018. CT A/P was negative for any evidence of metastatic disease in the abdomen or pelvis and the bone scan showed degenerative changes only and no evidence of osseous metastatic disease.  The patient was presented at the multidisciplinary prostate cancer clinic on 05/09/2018. At that time, he opted to enroll in the PROTEUS trial. In accordance with the trial, he received 6 months of neoadjuvant ADT and apalutimide/placebo before proceeding with prostatectomy. RALP was performed on 12/25/2018 under the care of Dr. Alinda Money, with final surgical pathology confirming ypT3bN1, Gleason 4+5 prostatic adenocarcinoma with extraprostatic extension identified at the right posterolateral mid and base, right seminal vesicle invasion present and a focally positive margin at the right posterolateral mid (1 mm). Out of a total of 13 biopsied lymph nodes, 1 out of 7 right pelvic nodes showed metastatic prostatic carcinoma with extranodal extension. All 6 of the left  pelvic nodes were benign.  He underwent postoperative restaging scans on 01/04/2019 as part of the PROTEUS trial. CT C/A/P showed no acute process or evidence of metastatic disease and the bone scan was also negative for metastatic disease. He received an additional 6 months of ADT and apalutimide/placebo as part of the trial. His initial postoperative PSA and subsequent PSA's have remained undetectable (as of 03/06/2020).  The patient has kindly been referred back to the multidisciplinary prostate cancer clinic today for presentation of pathology and radiology studies in our conference for discussion of potential adjuvant radiation treatment options and clinical evaluation.  PREVIOUS RADIATION THERAPY: No  PAST MEDICAL HISTORY:  has a past medical history of Chronic kidney disease, History of kidney stones, and Prostate cancer ([place]).    PAST SURGICAL HISTORY: Past Surgical History:  Procedure Laterality Date  . CYSTOSCOPY WITH RETROGRADE PYELOGRAM, URETEROSCOPY AND STENT PLACEMENT Right 05/27/2015   Procedure: CYSTOSCOPY WITH RIGHT  RETROGRADE PYELOGRAM, URETEROSCOPY AND STENT PLACEMENT;  Surgeon: Festus Aloe, MD;  Location: [facility];  Service: Urology;  Laterality: Right;  . HOLMIUM LASER APPLICATION Right 45/36/4680   Procedure: HOLMIUM LASER APPLICATION;  Surgeon: Festus Aloe, MD;  Location: [facility];  Service: Urology;  Laterality: Right;  . LYMPHADENECTOMY Bilateral 12/25/2018   Procedure: LYMPHADENECTOMY;  Surgeon: Raynelle Bring, MD;  Location: [facility];  Service: Urology;  Laterality: Bilateral;  . NO PAST SURGERIES    . ROBOT ASSISTED LAPAROSCOPIC RADICAL PROSTATECTOMY N/A 12/25/2018   Procedure: XI ROBOTIC ASSISTED LAPAROSCOPIC RADICAL PROSTATECTOMY LEVEL 3;  Surgeon: Raynelle Bring, MD;  Location: [facility];  Service: Urology;  Laterality: N/A;  NEEDS 210 MIN FOR ALL  PROCEDURES    FAMILY HISTORY: family history includes Bladder Cancer in his maternal uncle; Breast cancer in his  maternal grandmother; Leukemia in his maternal aunt.  SOCIAL HISTORY:  reports that he has never smoked. He has never used smokeless tobacco. He reports that he does not drink alcohol and does not use drugs.  He is married and lives at home in [place] with his wife and 2 small children ages 34 y/o and 65 y/o.  He works full-time as a Arts development officer.  ALLERGIES: Patient has no known allergies.  MEDICATIONS:  No current outpatient medications on file.   No current facility-administered medications for this encounter.    REVIEW OF SYSTEMS:  On review of systems, the patient reports that he is doing well overall. He denies any chest pain, shortness of breath, cough, fevers, chills, night sweats, unintended weight changes. He denies any bowel disturbances, and denies abdominal pain, nausea or vomiting. He denies any new musculoskeletal or joint aches or pains. His IPSS today was 1, indicating very mild urinary symptoms. His only complaint is nocturia x1. 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 A complete review of systems is obtained and is otherwise negative.   PHYSICAL EXAM:  Wt Readings from Last 3 Encounters:  04/01/20 224 lb 6.4 oz (101.8 kg)  12/25/18 211 lb 9 oz (96 kg)  12/20/18 211 lb 9 oz (96 kg)   Temp Readings from Last 3 Encounters:  04/01/20 98 F (36.7 C)  12/26/18 (!) 97.5 F (36.4 C) (Oral)  12/20/18 98 F (36.7 C) (Oral)   BP Readings from Last 3 Encounters:  04/01/20 133/76  12/26/18 (!) 105/54  12/20/18 134/78   Pulse Readings from Last 3 Encounters:  04/01/20 (!) 57  12/26/18 (!) 56  12/20/18 60   Pain Assessment Pain Score: 0-No pain/10  In general this is a well appearing African American gentleman in no acute distress. He is alert and oriented x4 and appropriate throughout the examination. HEENT reveals that the patient is normocephalic, atraumatic. EOMs are intact. PERRLA. Skin is intact without any  evidence of gross lesions. Cardiopulmonary assessment is negative for acute distress and he exhibits normal effort. The abdomen is soft, non tender, non distended. Lower extremities are negative for pretibial pitting edema, deep calf tenderness, cyanosis or clubbing.  KPS = 100  100 - Normal; no complaints; no evidence of disease. 90   - Able to carry on normal activity; minor signs or symptoms of disease. 80   - Normal activity with effort; some signs or symptoms of disease. 43   - Cares for self; unable to carry on normal activity or to do active work. 60   - Requires occasional assistance, but is able to care for most of his personal needs. 50   - Requires considerable assistance and frequent medical care. 48   - Disabled; requires special care and assistance. 11   - Severely disabled; hospital admission is indicated although death not imminent. 13   - Very sick; hospital admission necessary; active supportive treatment necessary. 10   - Moribund; fatal processes progressing rapidly. 0     - Dead  Karnofsky DA, Abelmann Shelley, Craver LS and Burchenal [facility] [phone]) The use of the nitrogen mustards in the palliative treatment of carcinoma: with particular reference to bronchogenic carcinoma Cancer 1 634-56   LABORATORY DATA:  Lab Results  Component Value Date   WBC 3.2 (L) 12/20/2018   HGB 12.3 (L) 12/26/2018  HCT 37.3 (L) 12/26/2018   MCV 90.9 12/20/2018   PLT 183 12/20/2018   Lab Results  Component Value Date   NA 140 12/20/2018   K 4.1 12/20/2018   CL 109 12/20/2018   CO2 23 12/20/2018   No results found for: ALT, AST, GGT, ALKPHOS, BILITOT   RADIOGRAPHY: No results found.    IMPRESSION/PLAN: 49 y.o. gentleman with adverse surgical pathology s/p RALP in 12/2018 for stage ypT3b, ypN1 prostate cancer.  Today, we reviewed the findings and workup thus far.  We discussed the natural history of high risk prostate cancer.  We reviewed the the implications of positive margins,  extracapsular extension, and seminal vesicle involvement on the risk of prostate cancer recurrence. We reviewed some of the evidence suggesting an advantage for patients who undergo adjuvant radiotherapy in the setting in terms of disease control and overall survival. We also discussed some of the dilemmas related to the available evidence.  We discussed the SWOG trial which did show an improvement in disease-free survival as well as overall survival using adjuvant radiotherapy. However, we discussed the fact that the study did not carefully control the usage of adjuvant radiotherapy in the observation arm. There is increasing evidence that careful surveillance with ultrasensitive PSA may provide an opportunity for early salvage in patients who undergo observation, which can lead to excellent results in terms of disease control and survival. We discussed radiation treatment directed to the prostatic fossa with regard to the logistics and delivery of a 7-1/2-week course of daily external beam radiation treatment.  He and his wife are encouraged to ask questions that were answered to their stated satisfaction.  At the end of our conversation, the patient would like to proceed with the recommended 7-1/2-week course of daily external beam radiotherapy to the prostate fossa.  He appears to have a good understanding of his disease and our treatment recommendations which are of curative intent.  He has provided verbal consent to proceed today and will be scheduled for CT simulation in the near future, in anticipation of proceeding with the daily radiation treatments shortly thereafter.  We will share our discussion with Dr. Alinda Money and proceed with treatment planning accordingly.  He knows that he is welcome to call at anytime with any questions or concerns in the interim. We enjoyed meeting with him today and look forward to continuing to participate in his care.      Nicholos Johns, PA-C    Tyler Pita, MD   [facility] Direct Dial: [phone]  Fax: [phone] [email or web address]  Skype  [email or web address]  This document serves as a record of services personally performed by Tyler Pita, MD and Freeman Caldron, PA-C. It was created on their behalf by Wilburn Mylar, a trained medical scribe. The creation of this record is based on the scribe's personal observations and the provider's statements to them. This document has been checked and approved by the attending provider.

## 2020-04-01 NOTE — Consult Note (Signed)
[facility]     04/01/2020   --------------------------------------------------------------------------------   Blake Maxwell  MRN: [id number]  DOB: 1970-09-09, 49 year old Male  SSN: [id number]   PRIMARY CARE:    REFERRING:  Blake Maxwell  PROVIDER:  Festus Maxwell, M.D.  TREATING:  Blake Maxwell, M.D.  LOCATION:  [facility] [phone]     --------------------------------------------------------------------------------   CC/HPI: CC: Prostate Cancer   Location of consult: [name] is a 49 year old gentleman previously seen in the multidisciplinary clinic in November 2019. He presented with an elevated PSA of 56.3 and a left apical prostate nodule prompting a TRUS biopsy of the prostate on 03/28/18 confirming 12 out of 12 biopsy cores positive for Gleason 4+5=9 adenocarcinoma. CT imaging and bone scan imaging for staging were negative. After reviewing options, he elected to enroll in the PROTEUS trial and received 6 months of neoadjuvant hormonal therapy including ADT plus placebo vs apalutamide followed by radical prostatectomy and then 6 months of adjuvant hormonal therapy of the same regimen. He underwent a NNS RAL radical prostatectomy and BPLND on 12/25/18. His surgical pathology indicated pT3b N1 Mx, Gleason 4+5=9 adenocarcinoma with a focal positive margin and 1 out of 13 lymph nodes positive for malignancy. His PSA values which were blinded pre-operatively were unblinded post-operatively and have remained undetectable since surgery and as of September 2021. He has regained continence. His libido is improving but still lacking and he has not made a big effort to attempt erectile activity.   PMH: He has a history of no medical comorbidities.   Baseline erectile function: SHIM score is 25.     ALLERGIES: No Allergies    MEDICATIONS: Multiple Vitamins     GU PSH:  Laparoscopy; Lymphadenectomy - 12/25/2018 Locm 300-399Mg /Ml Iodine,1Ml - 04/17/2018 Prostate Needle Biopsy - 2019 Robotic Radical Prostatectomy - 12/25/2018 Ureteroscopic laser litho - 2016       PSH Notes: Cystoscopy Ureteroscopy Lithotripsy Incl Insert Indwelling Ureter Stent, No Surgical Problems   NON-GU PSH: Surgical Pathology, Gross And Microscopic Examination For Prostate Needle - 2019     GU PMH: ED due to arterial insufficiency - 01/02/2019 Stress Incontinence - 01/02/2019 Prostate Cancer - 04/18/2018 Ureteral calculus, Right ureteral stone - 2017 Renal calculus, Nephrolithiasis - 2016      PMH Notes:   1) Prostate cancer: He enrolled in the PROTEUS trial. He was treated with 6 months of neoadjuvant therapy (ADT +/- apalutamide) and is s/p a NNS RAL radical prostatectomy and BPLND on 12/25/18. He then completed 6 months of adjuvant therapy (ADT +/- apalutamide).   Diagnosis: pT3b N1 Mx, Gleason 4+5=9 adenocarcinoma with a focal positive margin (1/13 lymph nodes positive)  Pretreatment PSA: 56.3  Pretreatment SHIM score: 25   NON-GU PMH: No Non-GU PMH    FAMILY HISTORY: 2 daughters - No Family History Diabetes - Father Hypertension - Father No pertinent family history - Runs In Family   SOCIAL HISTORY: Marital Status: Married Preferred Language: English; Race: Black or African American Current Smoking Status: Patient has never smoked.   Tobacco Use Assessment Completed: Used Tobacco in last 30 days? Does not use smokeless tobacco. Has never drank.  Does not drink caffeine. Has not had a blood transfusion.     Notes: Married, Caffeine use, Occupation, Never a smoker, Number of children, Alcohol use   REVIEW OF SYSTEMS:    GU Review Male:   Patient  denies frequent urination, hard to postpone urination, burning/ pain with urination, get up at night to urinate, leakage of urine, stream starts and stops, trouble starting your streams, and have to strain to urinate .   Gastrointestinal (Upper):   Patient denies nausea and vomiting.  Gastrointestinal (Lower):   Patient denies diarrhea and constipation.  Constitutional:   Patient denies fever, night sweats, weight loss, and fatigue.  Skin:   Patient denies skin rash/ lesion and itching.  Eyes:   Patient denies blurred vision and double vision.  Ears/ Nose/ Throat:   Patient denies sore throat and sinus problems.  Hematologic/Lymphatic:   Patient denies swollen glands and easy bruising.  Cardiovascular:   Patient denies leg swelling and chest pains.  Respiratory:   Patient denies cough and shortness of breath.  Endocrine:   Patient denies excessive thirst.  Musculoskeletal:   Patient denies back pain and joint pain.  Neurological:   Patient denies headaches and dizziness.  Psychologic:   Patient denies depression and anxiety.   VITAL SIGNS: None   MULTI-SYSTEM PHYSICAL EXAMINATION:    Constitutional: Well-nourished. No physical deformities. Normally developed. Good grooming.     Complexity of Data:  Lab Test Review:   PSA  Records Review:   Pathology Reports, Previous Patient Records   11/13/19 09/17/19 07/11/19 02/26/19 03/23/18  PSA  Total PSA <0.015 ng/mL <0.015 ng/mL <0.015 ng/mL <0.015 ng/mL 56.30 ng/mL    01/10/19 11/29/18  Hormones  Testosterone, Total 28.8 ng/dL 18.2 ng/dL    PROCEDURES: None   ASSESSMENT:      ICD-10 Details  1 GU:   Prostate Cancer - C61    PLAN:           Document Letter(s):  Created for Patient: Clinical Summary         Notes:   1. Lymph node positive prostate cancer: We had a detailed discussion today regarding his situation and he is scheduled to see Dr. Tammi Maxwell later this afternoon. He is leaning toward proceeding with adjuvant radiation therapy considering his high risk for recurrence. We have reviewed that treatment today in detail including the potential risks and the potential benefits associated with this therapy. He feels comfortable proceeding in  most likely will proceed in the near future. I will then plan to see him in January or February for his next PSA and follow-up evaluation in less he is scheduled to be seen sooner a protocol for PROTEUS.   cc: Dr. Tyler Maxwell  Dr. Zola Maxwell        Next Appointment:      Next Appointment: 04/01/2020 01:15 PM    Appointment Type: Block Time    Location: [facility] [phone]    Provider: Raynelle Maxwell, M.D.    Reason for Visit: [place]      E & M CODES: We spent 26 minutes dedicated to evaluation and management time, including face to face interaction, discussions on coordination of care, documentation, result review, and discussion with others as applicable.

## 2020-04-01 NOTE — Progress Notes (Signed)
Hematology and Oncology Follow Up Visit  Blake Maxwell [id number] Apr 06, 1971 49 y.o. 04/01/2020 1:49 PM Jacquelyne Balint, NP   Principle Diagnosis: 49 year old with prostate cancer diagnosed in October 2019.  He was found to have Gleason score 4+5 = 9 high-volume disease with PSA 56.   Prior Therapy:  He is status post neoadjuvant therapy with ADT plus apalutamide or placebo as a part of clinical trial.  He is status post radical cystectomy completed on July 13th 2020.  The final pathology showed residual prostate cancer with treatment effect with the largest nodule measuring 2.6 cm.  Extraprostatic extension identified with 1 out of 7 lymph nodes involved with malignancy.  He is status post adjuvant ADT plus apalutamide or placebo which she completed for 6 months following his surgery.  Current therapy: Under evaluation for additional therapy.  Interim History: Blake Maxwell returns today to prostate cancer multidisciplinary clinic for further discussion.  Since his last visit, he underwent radical prostatectomy which she is tolerated very well without any residual complications.  He denies any frequency urgency or hesitancy.  He does not report any dysuria or incontinence.     Medications: I have reviewed the patient's current medications.  Current Outpatient Medications  Medication Sig Dispense Refill  . sulfamethoxazole-trimethoprim (BACTRIM DS) 800-160 MG tablet Take 1 tablet by mouth 2 (two) times daily. Start the day prior to foley removal appointment 6 tablet 0  . traMADol (ULTRAM) 50 MG tablet Take 1-2 tablets (50-100 mg total) by mouth every 6 (six) hours as needed for moderate pain or severe pain. 20 tablet 0   No current facility-administered medications for this visit.     Allergies: No Known Allergies    Physical Exam:  ECOG: 0 General appearance: alert and cooperative appeared without distress. Head: Normocephalic, without obvious  abnormality Oropharynx: No oral thrush or ulcers. Eyes: No scleral icterus.  Pupils are equal and round reactive to light. Lymph nodes: Cervical, supraclavicular, and axillary nodes normal. Heart:regular rate and rhythm, S1, S2 normal, no murmur, click, rub or gallop Lung:chest clear, no wheezing, rales, normal symmetric air entry Abdomin: soft, non-tender, without masses or organomegaly. Neurological: No motor, sensory deficits.  Intact deep tendon reflexes. Skin: No rashes or lesions.  No ecchymosis or petechiae. Musculoskeletal: No joint deformity or effusion.     Lab Results: Lab Results  Component Value Date   WBC 3.2 (L) 12/20/2018   HGB 12.3 (L) 12/26/2018   HCT 37.3 (L) 12/26/2018   MCV 90.9 12/20/2018   PLT 183 12/20/2018     Chemistry      Component Value Date/Time   NA 140 12/20/2018 1058   K 4.1 12/20/2018 1058   CL 109 12/20/2018 1058   CO2 23 12/20/2018 1058   BUN 20 12/20/2018 1058   CREATININE 0.99 12/20/2018 1058      Component Value Date/Time   CALCIUM 9.4 12/20/2018 1058        Impression and Plan:  49 year old with:  1.  Prostate cancer diagnosed in October 2019.  His Gleason score 4+5 = 9 and a PSA of 56.  He is status post neoadjuvant androgen deprivation therapy followed by radical prostatectomy and completed adjuvant androgen deprivation therapy plus apalutamide or placebo.  This case was discussed today in the prostate cancer multidisciplinary clinic including review of his pathology results.  Imaging studies were also discussed with radiology and his PSA continues to be undetectable.  The natural course of this disease was reviewed and treatment  options were reiterated.  At this time given his positive margins we have recommended proceeding with adjuvant radiation therapy and defer any systemic therapy for the time being unless he develops recurrent disease in the future.  He understands he has high risk disease could develop metastasis and  aggressive therapy is recommended.  He is agreeable to consider radiation therapy at this time.  2.  Genetic counseling considerations: Given his high risk prostate cancer and his age I recommended genetic counseling which she is considering at this time.  Medical oncology follow-up will be as needed in the future.  30  minutes were dedicated to this visit. The time was spent on reviewing laboratory data, imaging studies, discussing treatment options, and answering questions regarding future plan.   Zola Button, MD 10/19/20211:49 PM

## 2020-04-01 NOTE — Progress Notes (Signed)
                               Care Plan Summary  Name: Blake Maxwell DOB: Mar 05, 1971   Your Medical Team:   Urologist -  Dr. Raynelle Bring, [facility]  Radiation Oncologist - Dr. Tyler Pita, [facility]   Medical Oncologist - Dr. Zola Button, [place]  Recommendations: 1) Radiation   * These recommendations are based on information available as of today's consult.      Recommendations may change depending on the results of further tests or exams. Next Steps: 1) Dr. Johny Shears office will schedule CT simulation.     When appointments need to be scheduled, you will be contacted by [facility] and/or [facility].  Questions?  Please do not hesitate to call Cira Rue, RN, BSN, OCN at [phone] any questions or concerns.  Shirlean Mylar is your Oncology Nurse Navigator and is available to assist you while you're receiving your medical care at [facility].

## 2020-04-07 ENCOUNTER — Encounter: Payer: Self-pay | Admitting: General Practice

## 2020-04-07 NOTE — Progress Notes (Signed)
Blake Maxwell filled out a distress screen at his initial prostate clinic and scored himself at a 5 with concerns about his health, anxiety and sexual problems. I followed up with him by phone to provide support.  He reported that he is doing well and has a good understanding of the plan for his care.  He often copes by processing on his own, but he does feel well supported by his wife and parents and by his friends.  He has also had conversations at appointments and clinics with other men going through cancer treatment and he has found that helpful.  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  He would appreciate a follow up call in a few weeks.  [place], [place] Pager, [phone] 3:08 PM

## 2020-04-10 ENCOUNTER — Encounter: Payer: Self-pay | Admitting: Medical Oncology

## 2020-04-10 NOTE — Progress Notes (Signed)
Spoke to patient as follow up to [facility]. He states he is doing well and confirmed his appointment for CT simulation 10/29 @ 3 pm.

## 2020-04-11 ENCOUNTER — Other Ambulatory Visit: Payer: Self-pay

## 2020-04-11 ENCOUNTER — Ambulatory Visit
Admission: RE | Admit: 2020-04-11 | Discharge: 2020-04-11 | Disposition: A | Discharge status: Home / Self Care | Payer: Managed Care, Other (non HMO) | Source: Ambulatory Visit | Attending: Radiation Oncology | Admitting: Radiation Oncology

## 2020-04-11 ENCOUNTER — Telehealth: Payer: Self-pay | Admitting: *Deleted

## 2020-04-11 DIAGNOSIS — C61 Malignant neoplasm of prostate: Secondary | ICD-10-CM | POA: Insufficient documentation

## 2020-04-11 DIAGNOSIS — C775 Secondary and unspecified malignant neoplasm of intrapelvic lymph nodes: Secondary | ICD-10-CM | POA: Insufficient documentation

## 2020-04-11 NOTE — Telephone Encounter (Signed)
Called patient to ask about coming in today @ 2:15 pm per [employer] request, patient agreed to do so.

## 2020-04-17 NOTE — Progress Notes (Signed)
°  Radiation Oncology         [phone] ________________________________  Name: Blake Maxwell MRN: [id number]  Date: 04/11/2020  DOB: [birth year]  SIMULATION AND TREATMENT PLANNING NOTE    ICD-10-CM   1. Malignant neoplasm of prostate ([place])  C61     DIAGNOSIS:  49 y.o. gentleman with adverse surgical pathology s/p RALP in 12/2018 for stage ypT3b, ypN1 prostate cancer  NARRATIVE:  The patient was brought to the [facility].  Identity was confirmed.  All relevant records and images related to the planned course of therapy were reviewed.  The patient freely provided informed written consent to proceed with treatment after reviewing the details related to the planned course of therapy. The consent form was witnessed and verified by the simulation staff.  Then, the patient was set-up in a stable reproducible supine position for radiation therapy.  A vacuum lock pillow device was custom fabricated to position his legs in a reproducible immobilized position.  Then, I performed a urethrogram under sterile conditions to identify the prostatic bed.  CT images were obtained.  Surface markings were placed.  The CT images were loaded into the planning software.  Then the prostate bed target, pelvic lymph node target and avoidance structures including the rectum, bladder, bowel and hips were contoured.  Treatment planning then occurred.  The radiation prescription was entered and confirmed.  A total of one complex treatment devices were fabricated. I have requested : Intensity Modulated Radiotherapy (IMRT) is medically necessary for this case for the following reason:  Rectal sparing.Marland Kitchen  PLAN:  The patient will receive 45 Gy in 25 fractions of 1.8 Gy, followed by a boost to the prostate bed to a total dose of 68.4 Gy with 13 additional fractions of 1.8 Gy.   ________________________________  Sheral Apley Tammi Klippel, M.D.

## 2020-04-18 DIAGNOSIS — C775 Secondary and unspecified malignant neoplasm of intrapelvic lymph nodes: Secondary | ICD-10-CM | POA: Diagnosis not present

## 2020-04-18 DIAGNOSIS — C61 Malignant neoplasm of prostate: Secondary | ICD-10-CM | POA: Insufficient documentation

## 2020-04-22 ENCOUNTER — Encounter: Payer: Self-pay | Admitting: Medical Oncology

## 2020-04-22 ENCOUNTER — Other Ambulatory Visit: Payer: Self-pay

## 2020-04-22 ENCOUNTER — Ambulatory Visit
Admission: RE | Admit: 2020-04-22 | Discharge: 2020-04-22 | Disposition: A | Discharge status: Home / Self Care | Payer: Managed Care, Other (non HMO) | Source: Ambulatory Visit | Attending: Radiation Oncology | Admitting: Radiation Oncology

## 2020-04-22 DIAGNOSIS — C61 Malignant neoplasm of prostate: Secondary | ICD-10-CM | POA: Diagnosis not present

## 2020-04-23 ENCOUNTER — Other Ambulatory Visit: Payer: Self-pay

## 2020-04-23 ENCOUNTER — Ambulatory Visit
Admission: RE | Admit: 2020-04-23 | Discharge: 2020-04-23 | Disposition: A | Discharge status: Home / Self Care | Payer: Managed Care, Other (non HMO) | Source: Ambulatory Visit | Attending: Radiation Oncology | Admitting: Radiation Oncology

## 2020-04-23 DIAGNOSIS — C61 Malignant neoplasm of prostate: Secondary | ICD-10-CM | POA: Diagnosis not present

## 2020-04-24 ENCOUNTER — Ambulatory Visit
Admission: RE | Admit: 2020-04-24 | Discharge: 2020-04-24 | Disposition: A | Discharge status: Home / Self Care | Payer: Managed Care, Other (non HMO) | Source: Ambulatory Visit | Attending: Radiation Oncology | Admitting: Radiation Oncology

## 2020-04-24 ENCOUNTER — Other Ambulatory Visit: Payer: Self-pay

## 2020-04-24 DIAGNOSIS — C61 Malignant neoplasm of prostate: Secondary | ICD-10-CM | POA: Diagnosis not present

## 2020-04-25 ENCOUNTER — Ambulatory Visit
Admission: RE | Admit: 2020-04-25 | Discharge: 2020-04-25 | Disposition: A | Discharge status: Home / Self Care | Payer: Managed Care, Other (non HMO) | Source: Ambulatory Visit | Attending: Radiation Oncology | Admitting: Radiation Oncology

## 2020-04-25 DIAGNOSIS — C61 Malignant neoplasm of prostate: Secondary | ICD-10-CM | POA: Diagnosis not present

## 2020-04-28 ENCOUNTER — Ambulatory Visit
Admission: RE | Admit: 2020-04-28 | Discharge: 2020-04-28 | Disposition: A | Discharge status: Home / Self Care | Payer: Managed Care, Other (non HMO) | Source: Ambulatory Visit | Attending: Radiation Oncology | Admitting: Radiation Oncology

## 2020-04-28 ENCOUNTER — Other Ambulatory Visit: Payer: Self-pay

## 2020-04-28 DIAGNOSIS — C61 Malignant neoplasm of prostate: Secondary | ICD-10-CM | POA: Diagnosis not present

## 2020-04-29 ENCOUNTER — Ambulatory Visit
Admission: RE | Admit: 2020-04-29 | Discharge: 2020-04-29 | Disposition: A | Discharge status: Home / Self Care | Payer: Managed Care, Other (non HMO) | Source: Ambulatory Visit | Attending: Radiation Oncology | Admitting: Radiation Oncology

## 2020-04-29 DIAGNOSIS — C61 Malignant neoplasm of prostate: Secondary | ICD-10-CM | POA: Diagnosis not present

## 2020-04-30 ENCOUNTER — Ambulatory Visit
Admission: RE | Admit: 2020-04-30 | Discharge: 2020-04-30 | Disposition: A | Discharge status: Home / Self Care | Payer: Managed Care, Other (non HMO) | Source: Ambulatory Visit | Attending: Radiation Oncology | Admitting: Radiation Oncology

## 2020-04-30 ENCOUNTER — Other Ambulatory Visit: Payer: Self-pay

## 2020-04-30 DIAGNOSIS — C61 Malignant neoplasm of prostate: Secondary | ICD-10-CM | POA: Diagnosis not present

## 2020-05-01 ENCOUNTER — Ambulatory Visit
Admission: RE | Admit: 2020-05-01 | Discharge: 2020-05-01 | Disposition: A | Discharge status: Home / Self Care | Payer: Managed Care, Other (non HMO) | Source: Ambulatory Visit | Attending: Radiation Oncology | Admitting: Radiation Oncology

## 2020-05-01 DIAGNOSIS — C61 Malignant neoplasm of prostate: Secondary | ICD-10-CM | POA: Diagnosis not present

## 2020-05-02 ENCOUNTER — Other Ambulatory Visit: Payer: Self-pay

## 2020-05-02 ENCOUNTER — Ambulatory Visit
Admission: RE | Admit: 2020-05-02 | Discharge: 2020-05-02 | Disposition: A | Discharge status: Home / Self Care | Payer: Managed Care, Other (non HMO) | Source: Ambulatory Visit | Attending: Radiation Oncology | Admitting: Radiation Oncology

## 2020-05-02 DIAGNOSIS — C61 Malignant neoplasm of prostate: Secondary | ICD-10-CM | POA: Diagnosis not present

## 2020-05-04 ENCOUNTER — Ambulatory Visit
Admission: RE | Admit: 2020-05-04 | Discharge: 2020-05-04 | Disposition: A | Discharge status: Home / Self Care | Payer: Managed Care, Other (non HMO) | Source: Ambulatory Visit | Attending: Radiation Oncology | Admitting: Radiation Oncology

## 2020-05-04 ENCOUNTER — Other Ambulatory Visit: Payer: Self-pay

## 2020-05-04 DIAGNOSIS — C61 Malignant neoplasm of prostate: Secondary | ICD-10-CM | POA: Diagnosis not present

## 2020-05-05 ENCOUNTER — Ambulatory Visit
Admission: RE | Admit: 2020-05-05 | Discharge: 2020-05-05 | Disposition: A | Discharge status: Home / Self Care | Payer: Managed Care, Other (non HMO) | Source: Ambulatory Visit | Attending: Radiation Oncology | Admitting: Radiation Oncology

## 2020-05-05 DIAGNOSIS — C61 Malignant neoplasm of prostate: Secondary | ICD-10-CM | POA: Diagnosis not present

## 2020-05-06 ENCOUNTER — Other Ambulatory Visit: Payer: Self-pay

## 2020-05-06 ENCOUNTER — Ambulatory Visit
Admission: RE | Admit: 2020-05-06 | Discharge: 2020-05-06 | Disposition: A | Discharge status: Home / Self Care | Payer: Managed Care, Other (non HMO) | Source: Ambulatory Visit | Attending: Radiation Oncology | Admitting: Radiation Oncology

## 2020-05-06 DIAGNOSIS — C61 Malignant neoplasm of prostate: Secondary | ICD-10-CM | POA: Diagnosis not present

## 2020-05-07 ENCOUNTER — Ambulatory Visit
Admission: RE | Admit: 2020-05-07 | Discharge: 2020-05-07 | Disposition: A | Discharge status: Home / Self Care | Payer: Managed Care, Other (non HMO) | Source: Ambulatory Visit | Attending: Radiation Oncology | Admitting: Radiation Oncology

## 2020-05-07 ENCOUNTER — Other Ambulatory Visit: Payer: Self-pay

## 2020-05-07 DIAGNOSIS — C61 Malignant neoplasm of prostate: Secondary | ICD-10-CM | POA: Diagnosis not present

## 2020-05-12 ENCOUNTER — Ambulatory Visit
Admission: RE | Admit: 2020-05-12 | Discharge: 2020-05-12 | Disposition: A | Discharge status: Home / Self Care | Payer: Managed Care, Other (non HMO) | Source: Ambulatory Visit | Attending: Radiation Oncology | Admitting: Radiation Oncology

## 2020-05-12 DIAGNOSIS — C61 Malignant neoplasm of prostate: Secondary | ICD-10-CM | POA: Diagnosis not present

## 2020-05-13 ENCOUNTER — Other Ambulatory Visit: Payer: Self-pay

## 2020-05-13 ENCOUNTER — Ambulatory Visit
Admission: RE | Admit: 2020-05-13 | Discharge: 2020-05-13 | Disposition: A | Discharge status: Home / Self Care | Payer: Managed Care, Other (non HMO) | Source: Ambulatory Visit | Attending: Radiation Oncology | Admitting: Radiation Oncology

## 2020-05-13 DIAGNOSIS — C61 Malignant neoplasm of prostate: Secondary | ICD-10-CM | POA: Diagnosis not present

## 2020-05-14 ENCOUNTER — Other Ambulatory Visit: Payer: Self-pay

## 2020-05-14 ENCOUNTER — Ambulatory Visit
Admission: RE | Admit: 2020-05-14 | Discharge: 2020-05-14 | Disposition: A | Discharge status: Home / Self Care | Payer: Managed Care, Other (non HMO) | Source: Ambulatory Visit | Attending: Radiation Oncology | Admitting: Radiation Oncology

## 2020-05-14 DIAGNOSIS — C775 Secondary and unspecified malignant neoplasm of intrapelvic lymph nodes: Secondary | ICD-10-CM | POA: Diagnosis not present

## 2020-05-14 DIAGNOSIS — C61 Malignant neoplasm of prostate: Secondary | ICD-10-CM | POA: Insufficient documentation

## 2020-05-15 ENCOUNTER — Ambulatory Visit
Admission: RE | Admit: 2020-05-15 | Discharge: 2020-05-15 | Disposition: A | Discharge status: Home / Self Care | Payer: Managed Care, Other (non HMO) | Source: Ambulatory Visit | Attending: Radiation Oncology | Admitting: Radiation Oncology

## 2020-05-15 DIAGNOSIS — C61 Malignant neoplasm of prostate: Secondary | ICD-10-CM | POA: Diagnosis not present

## 2020-05-16 ENCOUNTER — Other Ambulatory Visit: Payer: Self-pay

## 2020-05-16 ENCOUNTER — Ambulatory Visit
Admission: RE | Admit: 2020-05-16 | Discharge: 2020-05-16 | Disposition: A | Discharge status: Home / Self Care | Payer: Managed Care, Other (non HMO) | Source: Ambulatory Visit | Attending: Radiation Oncology | Admitting: Radiation Oncology

## 2020-05-16 DIAGNOSIS — C61 Malignant neoplasm of prostate: Secondary | ICD-10-CM | POA: Diagnosis not present

## 2020-05-18 ENCOUNTER — Ambulatory Visit: Payer: Managed Care, Other (non HMO)

## 2020-05-19 ENCOUNTER — Ambulatory Visit
Admission: RE | Admit: 2020-05-19 | Discharge: 2020-05-19 | Disposition: A | Discharge status: Home / Self Care | Payer: Managed Care, Other (non HMO) | Source: Ambulatory Visit | Attending: Radiation Oncology | Admitting: Radiation Oncology

## 2020-05-19 DIAGNOSIS — C61 Malignant neoplasm of prostate: Secondary | ICD-10-CM | POA: Diagnosis not present

## 2020-05-20 ENCOUNTER — Ambulatory Visit
Admission: RE | Admit: 2020-05-20 | Discharge: 2020-05-20 | Disposition: A | Discharge status: Home / Self Care | Payer: Managed Care, Other (non HMO) | Source: Ambulatory Visit | Attending: Radiation Oncology | Admitting: Radiation Oncology

## 2020-05-20 DIAGNOSIS — C61 Malignant neoplasm of prostate: Secondary | ICD-10-CM | POA: Diagnosis not present

## 2020-05-21 ENCOUNTER — Other Ambulatory Visit: Payer: Self-pay

## 2020-05-21 ENCOUNTER — Ambulatory Visit
Admission: RE | Admit: 2020-05-21 | Discharge: 2020-05-21 | Disposition: A | Discharge status: Home / Self Care | Payer: Managed Care, Other (non HMO) | Source: Ambulatory Visit | Attending: Radiation Oncology | Admitting: Radiation Oncology

## 2020-05-21 DIAGNOSIS — C61 Malignant neoplasm of prostate: Secondary | ICD-10-CM | POA: Diagnosis not present

## 2020-05-22 ENCOUNTER — Ambulatory Visit
Admission: RE | Admit: 2020-05-22 | Discharge: 2020-05-22 | Disposition: A | Discharge status: Home / Self Care | Payer: Managed Care, Other (non HMO) | Source: Ambulatory Visit | Attending: Radiation Oncology | Admitting: Radiation Oncology

## 2020-05-22 ENCOUNTER — Other Ambulatory Visit: Payer: Self-pay

## 2020-05-22 DIAGNOSIS — C61 Malignant neoplasm of prostate: Secondary | ICD-10-CM | POA: Diagnosis not present

## 2020-05-23 ENCOUNTER — Ambulatory Visit
Admission: RE | Admit: 2020-05-23 | Discharge: 2020-05-23 | Disposition: A | Discharge status: Home / Self Care | Payer: Managed Care, Other (non HMO) | Source: Ambulatory Visit | Attending: Radiation Oncology | Admitting: Radiation Oncology

## 2020-05-23 DIAGNOSIS — C61 Malignant neoplasm of prostate: Secondary | ICD-10-CM | POA: Diagnosis not present

## 2020-05-26 ENCOUNTER — Ambulatory Visit
Admission: RE | Admit: 2020-05-26 | Discharge: 2020-05-26 | Disposition: A | Discharge status: Home / Self Care | Payer: Managed Care, Other (non HMO) | Source: Ambulatory Visit | Attending: Radiation Oncology | Admitting: Radiation Oncology

## 2020-05-26 DIAGNOSIS — C61 Malignant neoplasm of prostate: Secondary | ICD-10-CM | POA: Diagnosis not present

## 2020-05-27 ENCOUNTER — Ambulatory Visit
Admission: RE | Admit: 2020-05-27 | Discharge: 2020-05-27 | Disposition: A | Discharge status: Home / Self Care | Payer: Managed Care, Other (non HMO) | Source: Ambulatory Visit | Attending: Radiation Oncology | Admitting: Radiation Oncology

## 2020-05-27 DIAGNOSIS — C61 Malignant neoplasm of prostate: Secondary | ICD-10-CM | POA: Diagnosis not present

## 2020-05-28 ENCOUNTER — Ambulatory Visit
Admission: RE | Admit: 2020-05-28 | Discharge: 2020-05-28 | Disposition: A | Discharge status: Home / Self Care | Payer: Managed Care, Other (non HMO) | Source: Ambulatory Visit | Attending: Radiation Oncology | Admitting: Radiation Oncology

## 2020-05-28 DIAGNOSIS — C61 Malignant neoplasm of prostate: Secondary | ICD-10-CM | POA: Diagnosis not present

## 2020-05-29 ENCOUNTER — Ambulatory Visit
Admission: RE | Admit: 2020-05-29 | Discharge: 2020-05-29 | Disposition: A | Discharge status: Home / Self Care | Payer: Managed Care, Other (non HMO) | Source: Ambulatory Visit | Attending: Radiation Oncology | Admitting: Radiation Oncology

## 2020-05-29 DIAGNOSIS — C61 Malignant neoplasm of prostate: Secondary | ICD-10-CM | POA: Diagnosis not present

## 2020-05-30 ENCOUNTER — Ambulatory Visit
Admission: RE | Admit: 2020-05-30 | Discharge: 2020-05-30 | Disposition: A | Discharge status: Home / Self Care | Payer: Managed Care, Other (non HMO) | Source: Ambulatory Visit | Attending: Radiation Oncology | Admitting: Radiation Oncology

## 2020-05-30 DIAGNOSIS — C61 Malignant neoplasm of prostate: Secondary | ICD-10-CM | POA: Diagnosis not present

## 2020-06-02 ENCOUNTER — Ambulatory Visit
Admission: RE | Admit: 2020-06-02 | Discharge: 2020-06-02 | Disposition: A | Discharge status: Home / Self Care | Payer: Managed Care, Other (non HMO) | Source: Ambulatory Visit | Attending: Radiation Oncology | Admitting: Radiation Oncology

## 2020-06-02 ENCOUNTER — Other Ambulatory Visit: Payer: Self-pay

## 2020-06-02 DIAGNOSIS — C61 Malignant neoplasm of prostate: Secondary | ICD-10-CM | POA: Diagnosis not present

## 2020-06-03 ENCOUNTER — Ambulatory Visit
Admission: RE | Admit: 2020-06-03 | Discharge: 2020-06-03 | Disposition: A | Discharge status: Home / Self Care | Payer: Managed Care, Other (non HMO) | Source: Ambulatory Visit | Attending: Radiation Oncology | Admitting: Radiation Oncology

## 2020-06-03 DIAGNOSIS — C61 Malignant neoplasm of prostate: Secondary | ICD-10-CM | POA: Diagnosis not present

## 2020-06-04 ENCOUNTER — Ambulatory Visit
Admission: RE | Admit: 2020-06-04 | Discharge: 2020-06-04 | Disposition: A | Discharge status: Home / Self Care | Payer: Managed Care, Other (non HMO) | Source: Ambulatory Visit | Attending: Radiation Oncology | Admitting: Radiation Oncology

## 2020-06-04 ENCOUNTER — Other Ambulatory Visit: Payer: Self-pay

## 2020-06-04 DIAGNOSIS — C61 Malignant neoplasm of prostate: Secondary | ICD-10-CM | POA: Diagnosis not present

## 2020-06-05 ENCOUNTER — Ambulatory Visit
Admission: RE | Admit: 2020-06-05 | Discharge: 2020-06-05 | Disposition: A | Discharge status: Home / Self Care | Payer: Managed Care, Other (non HMO) | Source: Ambulatory Visit | Attending: Radiation Oncology | Admitting: Radiation Oncology

## 2020-06-05 ENCOUNTER — Other Ambulatory Visit: Payer: Self-pay

## 2020-06-05 DIAGNOSIS — C61 Malignant neoplasm of prostate: Secondary | ICD-10-CM | POA: Diagnosis not present

## 2020-06-09 ENCOUNTER — Ambulatory Visit
Admission: RE | Admit: 2020-06-09 | Discharge: 2020-06-09 | Disposition: A | Discharge status: Home / Self Care | Payer: Managed Care, Other (non HMO) | Source: Ambulatory Visit | Attending: Radiation Oncology | Admitting: Radiation Oncology

## 2020-06-09 DIAGNOSIS — C61 Malignant neoplasm of prostate: Secondary | ICD-10-CM | POA: Diagnosis not present

## 2020-06-10 ENCOUNTER — Ambulatory Visit: Payer: Managed Care, Other (non HMO)

## 2020-06-11 ENCOUNTER — Ambulatory Visit
Admission: RE | Admit: 2020-06-11 | Discharge: 2020-06-11 | Disposition: A | Discharge status: Home / Self Care | Payer: Managed Care, Other (non HMO) | Source: Ambulatory Visit | Attending: Radiation Oncology | Admitting: Radiation Oncology

## 2020-06-11 DIAGNOSIS — C61 Malignant neoplasm of prostate: Secondary | ICD-10-CM | POA: Diagnosis not present

## 2020-06-12 ENCOUNTER — Ambulatory Visit
Admission: RE | Admit: 2020-06-12 | Discharge: 2020-06-12 | Disposition: A | Discharge status: Home / Self Care | Payer: Managed Care, Other (non HMO) | Source: Ambulatory Visit | Attending: Radiation Oncology | Admitting: Radiation Oncology

## 2020-06-12 DIAGNOSIS — C61 Malignant neoplasm of prostate: Secondary | ICD-10-CM | POA: Diagnosis not present

## 2020-06-16 ENCOUNTER — Ambulatory Visit
Admission: RE | Admit: 2020-06-16 | Discharge: 2020-06-16 | Disposition: A | Discharge status: Home / Self Care | Payer: Managed Care, Other (non HMO) | Source: Ambulatory Visit | Attending: Radiation Oncology | Admitting: Radiation Oncology

## 2020-06-16 ENCOUNTER — Ambulatory Visit: Payer: Managed Care, Other (non HMO)

## 2020-06-16 DIAGNOSIS — C61 Malignant neoplasm of prostate: Secondary | ICD-10-CM | POA: Diagnosis not present

## 2020-06-16 DIAGNOSIS — C775 Secondary and unspecified malignant neoplasm of intrapelvic lymph nodes: Secondary | ICD-10-CM | POA: Insufficient documentation

## 2020-06-17 ENCOUNTER — Ambulatory Visit: Payer: Managed Care, Other (non HMO)

## 2020-06-17 ENCOUNTER — Ambulatory Visit
Admission: RE | Admit: 2020-06-17 | Discharge: 2020-06-17 | Disposition: A | Discharge status: Home / Self Care | Payer: Managed Care, Other (non HMO) | Source: Ambulatory Visit | Attending: Radiation Oncology | Admitting: Radiation Oncology

## 2020-06-17 DIAGNOSIS — C61 Malignant neoplasm of prostate: Secondary | ICD-10-CM | POA: Diagnosis not present

## 2020-06-18 ENCOUNTER — Ambulatory Visit
Admission: RE | Admit: 2020-06-18 | Discharge: 2020-06-18 | Disposition: A | Discharge status: Home / Self Care | Payer: Managed Care, Other (non HMO) | Source: Ambulatory Visit | Attending: Radiation Oncology | Admitting: Radiation Oncology

## 2020-06-18 ENCOUNTER — Encounter: Payer: Self-pay | Admitting: Radiation Oncology

## 2020-06-18 ENCOUNTER — Ambulatory Visit: Payer: Managed Care, Other (non HMO)

## 2020-06-18 DIAGNOSIS — C61 Malignant neoplasm of prostate: Secondary | ICD-10-CM | POA: Diagnosis not present

## 2020-06-19 ENCOUNTER — Encounter: Payer: Self-pay | Admitting: Medical Oncology

## 2020-07-07 NOTE — Progress Notes (Signed)
  Radiation Oncology         [phone]) [phone] ________________________________  Name: Jahel Wavra Hailes MRN: [id number]  Date: 06/18/2020  DOB: [birth year]  End of Treatment Note  Diagnosis:   50 y.o. gentleman with adverse surgical pathology s/p RALP in 12/2018 for stage ypT3b, ypN1 prostate cancer and undetectable PSA     Indication for treatment:  Curative, Definitive Radiotherapy       Radiation treatment dates:   04/22/20-06/18/20  Site/dose:  1. The prostate fossa and pelvic lymph nodes were initially treated to 45 Gy in 25 fractions of 1.8 Gy  2. The prostate fossa only was boosted to 68.4 Gy with 13 additional fractions of 1.8 Gy   Beams/energy:  1. The prostate fossa  and pelvic lymph nodes were initially treated using VMAT intensity modulated radiotherapy delivering 6 megavolt photons. Image guidance was performed with CB-CT studies prior to each fraction. He was immobilized with a body fix lower extremity mold.  2. The prostate fossa only was boosted using VMAT intensity modulated radiotherapy delivering 6 megavolt photons. Image guidance was performed with CB-CT studies prior to each fraction. He was immobilized with a body fix lower extremity mold.  Narrative: The patient tolerated radiation treatment relatively well.   The patient experienced some minor urinary irritation and modest fatigue.    Plan: The patient has completed radiation treatment. He will return to radiation oncology clinic for routine followup in one month. I advised him to call or return sooner if he has any questions or concerns related to his recovery or treatment. ________________________________  Sheral Apley. Tammi Klippel, M.D.

## 2020-07-14 ENCOUNTER — Encounter: Payer: Self-pay | Admitting: Urology

## 2020-07-14 ENCOUNTER — Other Ambulatory Visit: Payer: Self-pay

## 2020-07-14 ENCOUNTER — Telehealth: Payer: Self-pay

## 2020-07-14 NOTE — Telephone Encounter (Signed)
Spoke with patient in regards to telephone visit with Blake Caldron PA on 07/23/20 @ 9:30am. Patient verbalized understanding of appointment date and time. Reviewed meaningful use, AUA and prostate questions. TM

## 2020-07-14 NOTE — Progress Notes (Signed)
Patient has telephone appointment with Freeman Caldron PA. Patient states nocturia 1-2 times per night. Patient denies hematuria. Patient states occasional mild dysuria. Patient states bowel movements are soft. Patient states urine stream is strong and steady. Patient states that he is emptying his bladder completely. Patient states urgency and is able to hold urine. Patient denies leakage. Patient denies pushing or straining during urination. Patient states that his fatigue is getting better. Patient states that he has a follow-up appointment with [facility].

## 2020-07-23 ENCOUNTER — Other Ambulatory Visit: Payer: Self-pay

## 2020-07-23 ENCOUNTER — Ambulatory Visit
Admission: RE | Admit: 2020-07-23 | Discharge: 2020-07-23 | Disposition: A | Discharge status: Home / Self Care | Payer: Managed Care, Other (non HMO) | Source: Ambulatory Visit | Attending: Urology | Admitting: Urology

## 2020-07-23 DIAGNOSIS — C61 Malignant neoplasm of prostate: Secondary | ICD-10-CM

## 2020-07-23 NOTE — Progress Notes (Signed)
  Radiation Oncology         [phone]) [phone] ________________________________  Name: Blake Maxwell MRN: [id number]  Date: 07/23/2020  DOB: 1971-05-07  Post Treatment Note  CC: Armanda Heritage, NP  Raynelle Bring, MD  Diagnosis:   50 y.o. gentleman with adverse surgical pathology s/p RALP in 12/2018 for stage ypT3b, ypN1 prostate cancer and undetectable PSA     Interval Since Last Radiation:  5 weeks  04/22/20-06/18/20: 1. The prostate fossa and pelvic lymph nodes were initially treated to 45 Gy in 25 fractions of 1.8 Gy  2. The prostate fossa only was boosted to 68.4 Gy with 13 additional fractions of 1.8 Gy   Narrative: I spoke with the patient to conduct his routine scheduled 1 month follow up visit via telephone to spare the patient unnecessary potential exposure in the healthcare setting during the current COVID-19 pandemic.  The patient was notified in advance and gave permission to proceed with this visit format.   He tolerated radiation treatment relatively well with only minor urinary irritation and modest fatigue.   He reported nocturia 1-2x/night and mild dysuria at the beginning of his stream. He specifically denied gross hematuria, straining to void, incomplete emptying or incontinence.                            On review of systems, the patient states that he is doing very well in general.  He continues with nocturia 1-2 times per night but denies gross hematuria, excessive daytime frequency, urgency, straining to void, incomplete bladder emptying or incontinence.  He has very mild residual dysuria at the start of the stream but reports that this is gradually improving.  He also continues with mild fatigue that is improving over time.  He denies any abdominal pain, nausea, vomiting, diarrhea or constipation.  He reports a healthy appetite and is maintaining his weight.  Overall, he is quite pleased with his progress to date.  ALLERGIES:  has No Known Allergies.  Meds: No current  outpatient medications on file.   No current facility-administered medications for this encounter.    Physical Findings:  vitals were not taken for this visit.   /Unable to assess due to telephone follow-up visit format.  Lab Findings: Lab Results  Component Value Date   WBC 3.2 (L) 12/20/2018   HGB 12.3 (L) 12/26/2018   HCT 37.3 (L) 12/26/2018   MCV 90.9 12/20/2018   PLT 183 12/20/2018     Radiographic Findings: No results found.  Impression/Plan: 1. 50 y.o. gentleman with adverse surgical pathology s/p RALP in 12/2018 for stage ypT3b, ypN1 prostate cancer and undetectable PSA. He will continue to follow up with urology for ongoing PSA determinations and had a recent follow-up visit with Dr. Alinda Money on Friday 07/18/20. He understands what to expect with regards to PSA monitoring going forward. I will look forward to following his response to treatment via correspondence with urology, and would be happy to continue to participate in his care if clinically indicated. I talked to the patient about what to expect in the future, including his risk for erectile dysfunction and rectal bleeding. I encouraged him to call or return to the office if he has any questions regarding his previous radiation or possible radiation side effects. He was comfortable with this plan and will follow up as needed.       Nicholos Johns, PA-C

## 2021-07-01 ENCOUNTER — Other Ambulatory Visit: Payer: Self-pay | Admitting: Urology

## 2021-07-01 ENCOUNTER — Other Ambulatory Visit (HOSPITAL_COMMUNITY): Payer: Self-pay | Admitting: Urology

## 2021-07-01 DIAGNOSIS — C61 Malignant neoplasm of prostate: Secondary | ICD-10-CM

## 2021-07-14 ENCOUNTER — Other Ambulatory Visit: Payer: Self-pay

## 2021-07-14 ENCOUNTER — Encounter (HOSPITAL_COMMUNITY)
Admission: RE | Admit: 2021-07-14 | Discharge: 2021-07-14 | Disposition: A | Discharge status: Home / Self Care | Payer: Managed Care, Other (non HMO) | Source: Ambulatory Visit | Attending: Urology | Admitting: Urology

## 2021-07-14 DIAGNOSIS — C61 Malignant neoplasm of prostate: Secondary | ICD-10-CM | POA: Insufficient documentation

## 2021-07-14 MED ORDER — TECHNETIUM TC 99M MEDRONATE IV KIT
20.0000 | PACK | Freq: Once | INTRAVENOUS | Status: AC | PRN
  Administered 2021-07-14: 20 via INTRAVENOUS

## 2021-07-17 ENCOUNTER — Other Ambulatory Visit (HOSPITAL_COMMUNITY): Payer: Self-pay | Admitting: Urology

## 2021-07-17 DIAGNOSIS — C61 Malignant neoplasm of prostate: Secondary | ICD-10-CM

## 2021-07-28 ENCOUNTER — Other Ambulatory Visit: Payer: Self-pay

## 2021-07-28 ENCOUNTER — Encounter (HOSPITAL_COMMUNITY)
Admission: RE | Admit: 2021-07-28 | Discharge: 2021-07-28 | Disposition: A | Discharge status: Home / Self Care | Payer: Self-pay | Source: Ambulatory Visit | Attending: Urology | Admitting: Urology

## 2021-07-28 DIAGNOSIS — C61 Malignant neoplasm of prostate: Secondary | ICD-10-CM | POA: Insufficient documentation

## 2021-07-28 MED ORDER — PIFLIFOLASTAT F 18 (PYLARIFY) INJECTION
9.0000 | Freq: Once | INTRAVENOUS | Status: AC
  Administered 2021-07-28: 9.5 via INTRAVENOUS

## 2022-01-22 ENCOUNTER — Other Ambulatory Visit: Payer: Self-pay | Admitting: Urology

## 2022-01-22 ENCOUNTER — Other Ambulatory Visit (HOSPITAL_COMMUNITY): Payer: Self-pay | Admitting: Urology

## 2022-01-22 DIAGNOSIS — C61 Malignant neoplasm of prostate: Secondary | ICD-10-CM

## 2022-01-25 ENCOUNTER — Other Ambulatory Visit (HOSPITAL_COMMUNITY): Payer: Self-pay | Admitting: Urology

## 2022-01-25 DIAGNOSIS — C61 Malignant neoplasm of prostate: Secondary | ICD-10-CM

## 2022-02-02 ENCOUNTER — Encounter (HOSPITAL_COMMUNITY)
Admission: RE | Admit: 2022-02-02 | Discharge: 2022-02-02 | Disposition: A | Discharge status: Home / Self Care | Payer: Self-pay | Source: Ambulatory Visit | Attending: Urology | Admitting: Urology

## 2022-02-02 ENCOUNTER — Other Ambulatory Visit (HOSPITAL_COMMUNITY): Payer: Managed Care, Other (non HMO)

## 2022-02-02 DIAGNOSIS — C7951 Secondary malignant neoplasm of bone: Secondary | ICD-10-CM | POA: Insufficient documentation

## 2022-02-02 DIAGNOSIS — C61 Malignant neoplasm of prostate: Secondary | ICD-10-CM | POA: Insufficient documentation

## 2022-02-02 MED ORDER — PIFLIFOLASTAT F 18 (PYLARIFY) INJECTION
9.0000 | Freq: Once | INTRAVENOUS | Status: AC
  Administered 2022-02-02: 9 via INTRAVENOUS

## 2022-02-05 ENCOUNTER — Other Ambulatory Visit: Payer: Self-pay | Admitting: Radiation Oncology

## 2022-02-05 ENCOUNTER — Ambulatory Visit
Admission: RE | Admit: 2022-02-05 | Discharge: 2022-02-05 | Disposition: A | Discharge status: Home / Self Care | Payer: Self-pay | Source: Ambulatory Visit | Attending: Radiation Oncology | Admitting: Radiation Oncology

## 2022-02-05 DIAGNOSIS — C61 Malignant neoplasm of prostate: Secondary | ICD-10-CM

## 2022-02-09 ENCOUNTER — Encounter (HOSPITAL_COMMUNITY)
Admission: RE | Admit: 2022-02-09 | Discharge: 2022-02-09 | Disposition: A | Discharge status: Home / Self Care | Payer: Managed Care, Other (non HMO) | Source: Ambulatory Visit | Attending: Urology | Admitting: Urology

## 2022-02-09 DIAGNOSIS — C61 Malignant neoplasm of prostate: Secondary | ICD-10-CM

## 2022-02-09 MED ORDER — TECHNETIUM TC 99M MEDRONATE IV KIT
20.0000 | PACK | Freq: Once | INTRAVENOUS | Status: AC | PRN
  Administered 2022-02-09: 20 via INTRAVENOUS

## 2022-02-19 NOTE — Progress Notes (Signed)
GU Location of Tumor / Histology: Prostate Ca (biochemical recurrence).  PSA is (2.08 as of 01/27/2022, 1.83 on 01/18/2022).  02/09/2022  Dr. Alinda Money NM Bone Scan Whole Body CLINICAL DATA:  History of prostate cancer.   FINDINGS: Bilateral renal function and excretion. Increased activity noted over both shoulders and sternoclavicular joints consistent with degenerative change. These findings are stable. Several foci of mild increased activity over the upper and midthoracic spine cannot be excluded. Reference is made to recent PET-CT report. MRI of the thoracic spine should be considered for further evaluation. Focal area of increased activity in the region of the right L2 transverse process is noted. This could represent an active lesion in the right L2 transverse process as noted on recent PET-CT.   IMPRESSION: 1. Several foci of mild increased activity noted over the upper and midthoracic spine cannot be excluded. Reference is made to recent PET-CT report. MRI of the thoracic spine should be considered for further evaluation.   2. Focal area of increased activity noted in the region of the right L2 transverse process. This could represent an active lesion in the right L2 transverse process as noted on recent PET-CT.  02/02/2022 NM PET ([facility]) Skull To Mid Thigh CLINICAL DATA:  Prostate carcinoma with biochemical recurrence.  Consider prostatectomy in July 2020.  IMPRESSION: 1. 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 Increased radiotracer activity at L2 transverse process skeletal metastasis. 3. No metastatic adenopathy in the pelvis periaortic retroperitoneum. 4. No visceral metastasis    07/14/2021 Dr. Alinda Money NM Bone Scan Whole Body CLINICAL DATA:  History of prostate cancer status post prostatectomy. Elevated PSA.  FINDINGS: No abnormal asymmetric increased radiotracer uptake suspicious for metastatic disease. Similar appearance of scattered symmetric  degenerative changes within the shoulders and sternoclavicular joints. Physiologic tracer activity noted within the kidneys and urinary bladder.   IMPRESSION: 1. No change from 01/04/2019.  No signs of osseous metastasis.       Past/Anticipated interventions by urology, if any: NA  Past/Anticipated interventions by medical oncology, if any: NA

## 2022-02-21 NOTE — Progress Notes (Signed)
Radiation Oncology         [phone] ________________________________  Outpatient Re-Consultation  Name: Blake Maxwell MRN: [id number]  Date: 02/22/2022  DOB: 28-Sep-1970  DJ:MEQAS, Blake Ogle, NP  Raynelle Bring, MD   REFERRING PHYSICIAN: Raynelle Bring, MD  DIAGNOSIS: 51 y.o. 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   HISTORY OF PRESENT ILLNESS::Blake Maxwell is a 51 y.o. gentleman with prostate cancer.  We initially met the patient in the multidisciplinary prostate cancer conference in November 2019 to discuss treatment of Gleason 4+5 adenocarcinoma of the prostate with a PSA of 56 at the time of diagnosis.  He had high-volume, high risk disease involving 12 of 12 core biopsies performed on 03/28/2018.   CT A/P and bone scan imaging for disease staging were performed on 04/17/2018. CT A/P was negative for any evidence of metastatic disease in the abdomen or pelvis and the bone scan showed degenerative changes only and no evidence of osseous metastatic disease.  The patient was presented at the multidisciplinary prostate cancer clinic on 05/09/2018. At that time, he opted to enroll in the PROTEUS trial. In accordance with the trial, he received 6 months of neoadjuvant ADT and apalutimide/placebo before proceeding with prostatectomy. RALP was performed on 12/25/2018 under the care of Dr. Alinda Money, with final surgical pathology confirming ypT3bN1, Gleason 4+5 prostatic adenocarcinoma with extraprostatic extension identified at the right posterolateral mid and base, right seminal vesicle invasion present and a focally positive margin at the right posterolateral mid (1 mm). Out of a total of 13 biopsied lymph nodes, 1 out of 7 right pelvic nodes showed metastatic prostatic carcinoma with extranodal extension. All 6 of the left pelvic nodes were benign.  He underwent postoperative restaging scans on  01/04/2019 as part of the PROTEUS trial. CT C/A/P showed no acute process or evidence of metastatic disease and the bone scan was also negative for metastatic disease. He received an additional 6 months of ADT and apalutimide/placebo as part of the trial. His initial postoperative PSA and subsequent PSA's remained undetectable through 03/06/2020.  He was again seen in our multidisciplinary clinic on 04/01/2020. At the end of our discussion, he agreed to proceed with our recommendation for 7.5 weeks of salvage radiation, completed on 06/18/2020. Unfortunately, his PSA became barely detectable at 0.05 in 11/2020. This rose to 0.81 by 07/2021, prompting PSMA PET scan. The results were initially read as negative for radiotracer-avid disease, and his PSA dropped to 0.46 in 09/2021. However, repeat on 01/18/2022 showed a significant jump to 1.83. He underwent repeat PSMA PET scan on 02/02/2022 showing: radiotracer-avid skeletal metastasis in T7 vertebral body and L2 transverse process; no metastatic adenopathy or visceral metastasis. This was compared to his prior PSMA PET scan on 07/28/2021, and his prior scan was found to have also shown the lesion in the L2 transverse process.  PREVIOUS RADIATION THERAPY: Yes 04/22/2020 - 06/18/2020: 1. The prostate fossa and pelvic lymph nodes were initially treated to 45 Gy in 25 fractions of 1.8 Gy  2. The prostate fossa only was boosted to 68.4 Gy with 13 additional fractions of 1.8 Gy   PAST MEDICAL HISTORY:  has a past medical history of Chronic kidney disease, History of kidney stones, and Prostate cancer ([place]).    PAST SURGICAL HISTORY: Past Surgical History:  Procedure Laterality Date   CYSTOSCOPY WITH RETROGRADE PYELOGRAM, URETEROSCOPY AND STENT PLACEMENT Right 05/27/2015  Procedure: CYSTOSCOPY WITH RIGHT  RETROGRADE PYELOGRAM, URETEROSCOPY AND STENT PLACEMENT;  Surgeon: Festus Aloe, MD;  Location: [facility];  Service: Urology;  Laterality: Right;   HOLMIUM LASER  APPLICATION Right 93/81/8299   Procedure: HOLMIUM LASER APPLICATION;  Surgeon: Festus Aloe, MD;  Location: [facility];  Service: Urology;  Laterality: Right;   LYMPHADENECTOMY Bilateral 12/25/2018   Procedure: LYMPHADENECTOMY;  Surgeon: Raynelle Bring, MD;  Location: [facility];  Service: Urology;  Laterality: Bilateral;   NO PAST SURGERIES     ROBOT ASSISTED LAPAROSCOPIC RADICAL PROSTATECTOMY N/A 12/25/2018   Procedure: XI ROBOTIC ASSISTED LAPAROSCOPIC RADICAL PROSTATECTOMY LEVEL 3;  Surgeon: Raynelle Bring, MD;  Location: [facility];  Service: Urology;  Laterality: N/A;  NEEDS 210 MIN FOR ALL PROCEDURES    FAMILY HISTORY: family history includes Bladder Cancer in his maternal uncle; Breast cancer in his maternal grandmother; Leukemia in his maternal aunt.  SOCIAL HISTORY:  reports that he has never smoked. He has never used smokeless tobacco. He reports that he does not drink alcohol and does not use drugs.  He is married and lives at home in [place] with his wife and 2 small children ages 13 y/o and 46 y/o.  He works full-time as a Arts development officer.  ALLERGIES: Patient has no known allergies.  MEDICATIONS:  No current outpatient medications on file.   No current facility-administered medications for this encounter.    REVIEW OF SYSTEMS:  On review of systems, the patient reports that he is doing well overall. He denies any chest pain, shortness of breath, cough, fevers, chills, night sweats, unintended weight changes. He denies any bowel disturbances, and denies abdominal pain, nausea or vomiting. He denies any new musculoskeletal or joint aches or pains. His IPSS today was 1, indicating very mild urinary symptoms. His only complaint is nocturia x1. 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 A complete review of systems is obtained and is otherwise negative.   PHYSICAL EXAM:  Wt Readings from Last 3 Encounters:  04/01/20 224 lb 6.4 oz (101.8 kg)   12/25/18 211 lb 9 oz (96 kg)  12/20/18 211 lb 9 oz (96 kg)   Temp Readings from Last 3 Encounters:  04/01/20 98 F (36.7 C)  12/26/18 (!) 97.5 F (36.4 C) (Oral)  12/20/18 98 F (36.7 C) (Oral)   BP Readings from Last 3 Encounters:  04/01/20 133/76  12/26/18 (!) 105/54  12/20/18 134/78   Pulse Readings from Last 3 Encounters:  04/01/20 (!) 57  12/26/18 (!) 56  12/20/18 60    /10  In general this is a well appearing African American gentleman in no acute distress. He is alert and oriented x4 and appropriate throughout the examination. HEENT reveals that the patient is normocephalic, atraumatic. EOMs are intact. PERRLA. Skin is intact without any evidence of gross lesions. Cardiopulmonary assessment is negative for acute distress and he exhibits normal effort. The abdomen is soft, non tender, non distended. Lower extremities are negative for pretibial pitting edema, deep calf tenderness, cyanosis or clubbing.  KPS = 100  100 - Normal; no complaints; no evidence of disease. 90   - Able to carry on normal activity; minor signs or symptoms of disease. 80   - Normal activity with effort; some signs or symptoms of disease. 71   - Cares for self; unable to carry on normal activity or to do active work. 60   - Requires occasional assistance, but is able to care for most of his  personal needs. 50   - Requires considerable assistance and frequent medical care. 42   - Disabled; requires special care and assistance. 26   - Severely disabled; hospital admission is indicated although death not imminent. 11   - Very sick; hospital admission necessary; active supportive treatment necessary. 10   - Moribund; fatal processes progressing rapidly. 0     - Dead  Karnofsky DA, Abelmann Long, Craver LS and [facility] [phone]) The use of the nitrogen mustards in the palliative treatment of carcinoma: with particular reference to bronchogenic carcinoma Cancer 1 [phone]   LABORATORY DATA:  Lab Results   Component Value Date   WBC 3.2 (L) 12/20/2018   HGB 12.3 (L) 12/26/2018   HCT 37.3 (L) 12/26/2018   MCV 90.9 12/20/2018   PLT 183 12/20/2018   Lab Results  Component Value Date   NA 140 12/20/2018   K 4.1 12/20/2018   CL 109 12/20/2018   CO2 23 12/20/2018   No results found for: "ALT", "AST", "GGT", "ALKPHOS", "BILITOT"   RADIOGRAPHY: NM Bone Scan Whole Body  Result Date: 02/10/2022 CLINICAL DATA:  History of prostate cancer. EXAM: NUCLEAR MEDICINE WHOLE BODY BONE SCAN TECHNIQUE: Whole body anterior and posterior images were obtained approximately 3 hours after intravenous injection of radiopharmaceutical. RADIOPHARMACEUTICALS:  20.7 mCi Technetium-44mMDP IV COMPARISON:  PET-CT 02/02/2022.  Bone scan 07/14/2021. FINDINGS: Bilateral renal function and excretion. Increased activity noted over both shoulders and sternoclavicular joints consistent with degenerative change. These findings are stable. Several foci of mild increased activity over the upper and midthoracic spine cannot be excluded. Reference is made to recent PET-CT report. MRI of the thoracic spine should be considered for further evaluation. Focal area of increased activity in the region of the right L2 transverse process is noted. This could represent an active lesion in the right L2 transverse process as noted on recent PET-CT. IMPRESSION: 1. Several foci of mild increased activity noted over the upper and midthoracic spine cannot be excluded. Reference is made to recent PET-CT report. MRI of the thoracic spine should be considered for further evaluation. 2. Focal area of increased activity noted in the region of the right L2 transverse process. This could represent an active lesion in the right L2 transverse process as noted on recent PET-CT. Electronically Signed   By: TMarcello Moores Register M.D.   On: 02/10/2022 08:39   NM PET (PSMA) SKULL TO MID THIGH  Result Date: 02/04/2022 CLINICAL DATA:  Prostate carcinoma with biochemical  recurrence. Consider prostatectomy in July 2020. EXAM: NUCLEAR MEDICINE PET SKULL BASE TO THIGH TECHNIQUE: 8.9 mCi F18 Piflufolastat (Pylarify) was injected intravenously. Full-ring PET imaging was performed from the skull base to thigh after the radiotracer. CT data was obtained and used for attenuation correction and anatomic localization. COMPARISON:  PSMA PET scan 07/28/2021 FINDINGS: NECK No radiotracer activity in neck lymph nodes. Incidental CT finding: None. CHEST No radiotracer accumulation within mediastinal or hilar lymph nodes. No suspicious pulmonary nodules on the CT scan. Incidental CT finding: None. ABDOMEN/PELVIS Prostate: No focal activity in the prostate bed. Lymph nodes: No abnormal radiotracer accumulation within pelvic or abdominal nodes. Liver: No evidence of liver metastasis. Incidental CT finding: Nonobstructing calculus within the LEFT kidney. SKELETON Small round sclerotic lesion measuring 6 mm within the T7 vertebral body has intense radiotracer activity SUV max equal 12.5. Intense activity localizing to the RIGHT transverse process of the L2 vertebral body with SUV max equal 20.9. Minimal sclerotic change on the CT portion exam (  image 146). In retrospect, there is a radiotracer avid lesion in the RIGHT transverse process with SUV max equal 11.7 on PSMA PET scan 07/28/2021. IMPRESSION: 1. 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 Increased radiotracer activity at L2 transverse process skeletal metastasis. 3. No metastatic adenopathy in the pelvis periaortic retroperitoneum. 4. No visceral metastasis Electronically Signed   By: Suzy Bouchard M.D.   On: 02/04/2022 10:30      IMPRESSION/PLAN: 51 y.o. 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 We discussed the natural history of prostate cancer  and general treatment, highlighting the role of radiotherapy in the management of skeletal metastasis. We discussed the available radiation techniques, and focused on the details and logistics of delivery. We reviewed the anticipated acute and late sequelae associated with radiation in this setting. The patient was encouraged to ask questions that were answered to his satisfaction.  At the end of our conversation, the patient ***   ------------------------------------------------   Tyler Pita, MD [place]: [phone]  Fax: [phone] [email or web address]  Skype  LinkedIn   This document serves as a record of services personally performed by Tyler Pita, MD. It was created on his behalf by Wilburn Mylar, a trained medical scribe. The creation of this record is based on the scribe's personal observations and the provider's statements to them. This document has been checked and approved by the attending provider.

## 2022-02-21 NOTE — Progress Notes (Signed)
  Radiation Oncology         [phone] ________________________________  Name: Blake Maxwell MRN: [id number]  Date: 02/22/2022  DOB: 10-May-1971  STEREOTACTIC BODY RADIOTHERAPY SIMULATION AND TREATMENT PLANNING NOTE    ICD-10-CM   1. Prostate cancer metastatic to bone [facility])  C61    C79.51       DIAGNOSIS:  51 yo gentleman with oligometastatic deposits at T7 and L2 from prostate cancer  NARRATIVE:  The patient was brought to the [facility].  Identity was confirmed.  All relevant records and images related to the planned course of therapy were reviewed.  The patient freely provided informed written consent to proceed with treatment after reviewing the details related to the planned course of therapy. The consent form was witnessed and verified by the simulation staff.  Then, the patient was set-up in a stable reproducible  supine position for radiation therapy.  A BodyFix immobilization pillow was fabricated for reproducible positioning.  Surface markings were placed.  The CT images were loaded into the planning software.  The gross target volumes (GTV) and planning target volumes (PTV) were delinieated, and avoidance structures were contoured.  Treatment planning then occurred.  The radiation prescription was entered and confirmed.  A total of two complex treatment devices were fabricated in the form of the BodyFix immobilization pillow and a neck accuform cushion.  I have requested : 3D Simulation  I have requested a DVH of the following structures: targets and all normal structures near the target including lungs, spinal cord, skin and others as noted on the radiation plan to maintain doses in adherence with established limits  SPECIAL TREATMENT PROCEDURE:  The planned course of therapy using radiation constitutes a special treatment procedure. Special care is required in the management of this patient for the following reasons. High dose per fraction requiring special  monitoring for increased toxicities of treatment including daily imaging..  The special nature of the planned course of radiotherapy will require increased physician supervision and oversight to ensure patient's safety with optimal treatment outcomes.    This requires extended time and effort.    PLAN:  The patient will receive 40 Gy in 5 fractions to T7 and L2.  ________________________________  Sheral Apley. Tammi Klippel, M.D.

## 2022-02-22 ENCOUNTER — Ambulatory Visit: Payer: Managed Care, Other (non HMO) | Admitting: Radiation Oncology

## 2022-02-22 ENCOUNTER — Encounter: Payer: Self-pay | Admitting: Radiation Oncology

## 2022-02-22 ENCOUNTER — Ambulatory Visit
Admission: RE | Admit: 2022-02-22 | Discharge: 2022-02-22 | Disposition: A | Discharge status: Home / Self Care | Payer: Managed Care, Other (non HMO) | Source: Ambulatory Visit | Attending: Urology | Admitting: Urology

## 2022-02-22 ENCOUNTER — Ambulatory Visit
Admission: RE | Admit: 2022-02-22 | Discharge: 2022-02-22 | Disposition: A | Discharge status: Home / Self Care | Payer: Managed Care, Other (non HMO) | Source: Ambulatory Visit | Attending: Radiation Oncology | Admitting: Radiation Oncology

## 2022-02-22 ENCOUNTER — Ambulatory Visit: Payer: Managed Care, Other (non HMO)

## 2022-02-22 VITALS — BP 124/72 | HR 78 | Temp 97.3°F | Resp 18 | Ht 69.0 in | Wt 209.0 lb

## 2022-02-22 DIAGNOSIS — Z87442 Personal history of urinary calculi: Secondary | ICD-10-CM | POA: Insufficient documentation

## 2022-02-22 DIAGNOSIS — N189 Chronic kidney disease, unspecified: Secondary | ICD-10-CM | POA: Insufficient documentation

## 2022-02-22 DIAGNOSIS — Z923 Personal history of irradiation: Secondary | ICD-10-CM | POA: Insufficient documentation

## 2022-02-22 DIAGNOSIS — Z510 Encounter for antineoplastic radiation therapy: Secondary | ICD-10-CM | POA: Insufficient documentation

## 2022-02-22 DIAGNOSIS — Z8051 Family history of malignant neoplasm of kidney: Secondary | ICD-10-CM | POA: Insufficient documentation

## 2022-02-22 DIAGNOSIS — C7951 Secondary malignant neoplasm of bone: Secondary | ICD-10-CM | POA: Insufficient documentation

## 2022-02-22 DIAGNOSIS — C61 Malignant neoplasm of prostate: Secondary | ICD-10-CM | POA: Insufficient documentation

## 2022-02-22 DIAGNOSIS — Z803 Family history of malignant neoplasm of breast: Secondary | ICD-10-CM | POA: Insufficient documentation

## 2022-02-22 DIAGNOSIS — Z806 Family history of leukemia: Secondary | ICD-10-CM | POA: Insufficient documentation

## 2022-02-22 NOTE — Progress Notes (Signed)
Reconsult appointment. I verified patient's identity and began nursing interview. Patient denies any spinal discomfort at this time.  Meaningful use complete.  BP 124/72 (BP Location: Left Arm, Patient Position: Sitting, Cuff Size: Normal)   Pulse 78   Temp (!) 97.3 F (36.3 C) (Oral)   Resp 18   Ht '5\' 9"'$  (1.753 m)   Wt 209 lb (94.8 kg)   SpO2 100%   BMI 30.86 kg/m

## 2022-03-03 ENCOUNTER — Ambulatory Visit: Payer: Managed Care, Other (non HMO) | Admitting: Radiation Oncology

## 2022-03-03 DIAGNOSIS — Z510 Encounter for antineoplastic radiation therapy: Secondary | ICD-10-CM | POA: Diagnosis not present

## 2022-03-04 ENCOUNTER — Ambulatory Visit: Payer: Managed Care, Other (non HMO) | Admitting: Radiation Oncology

## 2022-03-05 ENCOUNTER — Ambulatory Visit: Payer: Managed Care, Other (non HMO) | Admitting: Radiation Oncology

## 2022-03-08 ENCOUNTER — Other Ambulatory Visit: Payer: Self-pay

## 2022-03-08 ENCOUNTER — Ambulatory Visit
Admission: RE | Admit: 2022-03-08 | Discharge: 2022-03-08 | Disposition: A | Discharge status: Home / Self Care | Payer: Managed Care, Other (non HMO) | Source: Ambulatory Visit | Attending: Radiation Oncology | Admitting: Radiation Oncology

## 2022-03-08 DIAGNOSIS — Z510 Encounter for antineoplastic radiation therapy: Secondary | ICD-10-CM | POA: Diagnosis not present

## 2022-03-08 DIAGNOSIS — C61 Malignant neoplasm of prostate: Secondary | ICD-10-CM

## 2022-03-08 LAB — RAD ONC ARIA SESSION SUMMARY
Course Elapsed Days: 0
Plan Fractions Treated to Date: 1
Plan Fractions Treated to Date: 1
Plan Prescribed Dose Per Fraction: 8 Gy
Plan Prescribed Dose Per Fraction: 8 Gy
Plan Total Fractions Prescribed: 5
Plan Total Fractions Prescribed: 5
Plan Total Prescribed Dose: 40 Gy
Plan Total Prescribed Dose: 40 Gy
Reference Point Dosage Given to Date: 8 Gy
Reference Point Dosage Given to Date: 8 Gy
Reference Point Session Dosage Given: 8 Gy
Reference Point Session Dosage Given: 8 Gy
Session Number: 1

## 2022-03-09 ENCOUNTER — Ambulatory Visit: Payer: Managed Care, Other (non HMO) | Admitting: Radiation Oncology

## 2022-03-10 ENCOUNTER — Ambulatory Visit
Admission: RE | Admit: 2022-03-10 | Discharge: 2022-03-10 | Disposition: A | Discharge status: Home / Self Care | Payer: Managed Care, Other (non HMO) | Source: Ambulatory Visit | Attending: Radiation Oncology | Admitting: Radiation Oncology

## 2022-03-10 ENCOUNTER — Other Ambulatory Visit: Payer: Self-pay

## 2022-03-10 DIAGNOSIS — Z510 Encounter for antineoplastic radiation therapy: Secondary | ICD-10-CM | POA: Diagnosis not present

## 2022-03-10 DIAGNOSIS — C61 Malignant neoplasm of prostate: Secondary | ICD-10-CM

## 2022-03-10 LAB — RAD ONC ARIA SESSION SUMMARY
Course Elapsed Days: 2
Plan Fractions Treated to Date: 2
Plan Fractions Treated to Date: 2
Plan Prescribed Dose Per Fraction: 8 Gy
Plan Prescribed Dose Per Fraction: 8 Gy
Plan Total Fractions Prescribed: 5
Plan Total Fractions Prescribed: 5
Plan Total Prescribed Dose: 40 Gy
Plan Total Prescribed Dose: 40 Gy
Reference Point Dosage Given to Date: 16 Gy
Reference Point Dosage Given to Date: 16 Gy
Reference Point Session Dosage Given: 8 Gy
Reference Point Session Dosage Given: 8 Gy
Session Number: 2

## 2022-03-12 ENCOUNTER — Ambulatory Visit
Admission: RE | Admit: 2022-03-12 | Discharge: 2022-03-12 | Disposition: A | Discharge status: Home / Self Care | Payer: Managed Care, Other (non HMO) | Source: Ambulatory Visit | Attending: Radiation Oncology | Admitting: Radiation Oncology

## 2022-03-12 ENCOUNTER — Other Ambulatory Visit: Payer: Self-pay

## 2022-03-12 DIAGNOSIS — Z510 Encounter for antineoplastic radiation therapy: Secondary | ICD-10-CM | POA: Diagnosis not present

## 2022-03-12 DIAGNOSIS — C7951 Secondary malignant neoplasm of bone: Secondary | ICD-10-CM

## 2022-03-12 LAB — RAD ONC ARIA SESSION SUMMARY
Course Elapsed Days: 4
Plan Fractions Treated to Date: 3
Plan Fractions Treated to Date: 3
Plan Prescribed Dose Per Fraction: 8 Gy
Plan Prescribed Dose Per Fraction: 8 Gy
Plan Total Fractions Prescribed: 5
Plan Total Fractions Prescribed: 5
Plan Total Prescribed Dose: 40 Gy
Plan Total Prescribed Dose: 40 Gy
Reference Point Dosage Given to Date: 24 Gy
Reference Point Dosage Given to Date: 24 Gy
Reference Point Session Dosage Given: 8 Gy
Reference Point Session Dosage Given: 8 Gy
Session Number: 3

## 2022-03-16 ENCOUNTER — Ambulatory Visit
Admission: RE | Admit: 2022-03-16 | Discharge: 2022-03-16 | Disposition: A | Discharge status: Home / Self Care | Payer: Managed Care, Other (non HMO) | Source: Ambulatory Visit | Attending: Radiation Oncology | Admitting: Radiation Oncology

## 2022-03-16 ENCOUNTER — Other Ambulatory Visit: Payer: Self-pay

## 2022-03-16 DIAGNOSIS — C61 Malignant neoplasm of prostate: Secondary | ICD-10-CM | POA: Diagnosis present

## 2022-03-16 DIAGNOSIS — C7951 Secondary malignant neoplasm of bone: Secondary | ICD-10-CM | POA: Diagnosis present

## 2022-03-16 LAB — RAD ONC ARIA SESSION SUMMARY
Course Elapsed Days: 8
Plan Fractions Treated to Date: 4
Plan Fractions Treated to Date: 4
Plan Prescribed Dose Per Fraction: 8 Gy
Plan Prescribed Dose Per Fraction: 8 Gy
Plan Total Fractions Prescribed: 5
Plan Total Fractions Prescribed: 5
Plan Total Prescribed Dose: 40 Gy
Plan Total Prescribed Dose: 40 Gy
Reference Point Dosage Given to Date: 32 Gy
Reference Point Dosage Given to Date: 32 Gy
Reference Point Session Dosage Given: 8 Gy
Reference Point Session Dosage Given: 8 Gy
Session Number: 4

## 2022-03-18 ENCOUNTER — Other Ambulatory Visit: Payer: Self-pay

## 2022-03-18 ENCOUNTER — Ambulatory Visit
Admission: RE | Admit: 2022-03-18 | Discharge: 2022-03-18 | Disposition: A | Discharge status: Home / Self Care | Payer: Managed Care, Other (non HMO) | Source: Ambulatory Visit | Attending: Radiation Oncology | Admitting: Radiation Oncology

## 2022-03-18 ENCOUNTER — Encounter: Payer: Self-pay | Admitting: Urology

## 2022-03-18 DIAGNOSIS — C61 Malignant neoplasm of prostate: Secondary | ICD-10-CM | POA: Diagnosis not present

## 2022-03-18 LAB — RAD ONC ARIA SESSION SUMMARY
Course Elapsed Days: 10
Plan Fractions Treated to Date: 5
Plan Fractions Treated to Date: 5
Plan Prescribed Dose Per Fraction: 8 Gy
Plan Prescribed Dose Per Fraction: 8 Gy
Plan Total Fractions Prescribed: 5
Plan Total Fractions Prescribed: 5
Plan Total Prescribed Dose: 40 Gy
Plan Total Prescribed Dose: 40 Gy
Reference Point Dosage Given to Date: 40 Gy
Reference Point Dosage Given to Date: 40 Gy
Reference Point Session Dosage Given: 8 Gy
Reference Point Session Dosage Given: 8 Gy
Session Number: 5

## 2022-05-04 NOTE — Progress Notes (Signed)
                                                                                                                                                             Patient Name: [name] MRN: [id number] DOB: 1970-08-24 Referring Physician: Dutch Gray (Profile Not Attached) Date of Service: 03/18/2022 [facility] [place]                                                        End Of Treatment Note  Diagnoses: C79.51-Secondary malignant neoplasm of bone  Cancer Staging: 51 yo gentleman with oligometastatic deposits at T7 and L2 from prostate cancer   Intent: Curative  Radiation Treatment Dates: 03/08/2022 through 03/18/2022 Site Technique Total Dose (Gy) Dose per Fx (Gy) Completed Fx Beam Energies  Thoracic Spine: Spine_T7 IMRT 40/40 8 5/5 6XFFF  Lumbar Spine: Spine IMRT 40/40 8 5/5 6XFFF   Narrative: The patient tolerated radiation therapy relatively well with only mild fatigue.  Plan: The patient will receive a call in about one month from the radiation oncology department. He will continue follow up with Dr. Alinda Money as well.   Nicholos Johns, PA-C    Tyler Pita, MD  [facility] Direct Dial: [phone]  Fax: [phone] [email or web address]  Skype  LinkedIn

## 2022-05-12 NOTE — Progress Notes (Signed)
  Radiation Oncology         [phone] ________________________________  Name: Blake Maxwell MRN: [id number]  Date of Service: 05/13/2022  DOB: Aug 11, 1970  Post Treatment Telephone Note  Diagnosis:  51 yo gentleman with oligometastatic deposits at T7 and L2 from prostate cancer   Intent: Curative  Radiation Treatment Dates: 03/08/2022 through 03/18/2022 Site Technique Total Dose (Gy) Dose per Fx (Gy) Completed Fx Beam Energies  Thoracic Spine: Spine_T7 IMRT 40/40 8 5/5 6XFFF  Lumbar Spine: Spine       (as documented in provider EOT note)   The patient was available for call today.  The patient did  note fatigue during radiation but has improved. The patient did not note skin changes in the field of radiation during therapy. The patient has noticed improvement in pain in the area(s) treated with radiation. The patient is not taking dexamethasone. The patient does not have symptoms of  weakness or loss of control of the extremities. The patient does not have symptoms of headache. The patient does not have symptoms of seizure or uncontrolled movement. The patient does not have symptoms of changes in vision. The patient does not have changes in speech. The patient does not have confusion.   The patient is scheduled for ongoing care with Dr. Alinda Money on 07/16/2022 in medical oncology. The patient was encouraged to call if he develops concerns or questions regarding radiation.  This concludes the interview.   Leandra Kern, LPN

## 2022-05-13 ENCOUNTER — Ambulatory Visit
Admission: RE | Admit: 2022-05-13 | Discharge: 2022-05-13 | Disposition: A | Discharge status: Home / Self Care | Payer: Managed Care, Other (non HMO) | Source: Ambulatory Visit | Attending: Urology | Admitting: Urology

## 2022-05-13 DIAGNOSIS — C7951 Secondary malignant neoplasm of bone: Secondary | ICD-10-CM

## 2022-08-29 IMAGING — PT NM PET TUM IMG SKULL BASE T - THIGH
1 series · 1 of 1 positions shown · non-contrast
Comparison: Bone scan 07/14/2021

CLINICAL DATA: Subsequent treatment strategy for tumor type.

EXAM:
NUCLEAR MEDICINE PET SKULL BASE TO THIGH
TECHNIQUE: 9.5 mCi F18 Piflufolastat (Pylarify) was injected intravenously.
Full-ring PET imaging was performed from the skull base to thigh
after the radiotracer. CT data was obtained and used for attenuation
correction and anatomic localization.

[Series 1223: results mm oncology reading · 1.2mm · 1.20mm/px · 1 of 1 slices shown]
[im 1/1]
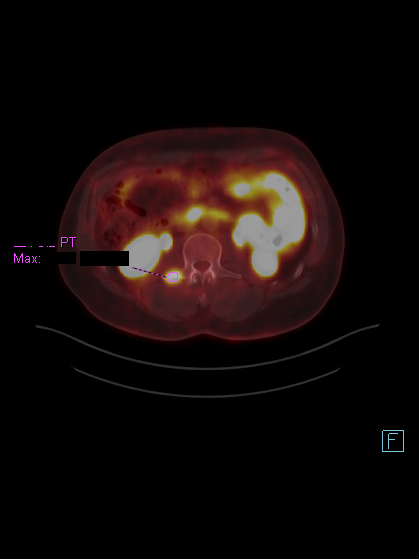

[1 of 1 positions shown; findings below may reference images not displayed]

FINDINGS: No radiotracer avid lymph nodes in neck

Incidental CT finding: None

CHEST

No radiotracer accumulation within mediastinal or hilar lymph nodes.
No suspicious pulmonary nodules on the CT scan.

Incidental CT finding: None

ABDOMEN/PELVIS

Prostate: No focal activity in prostatectomy bed.

Lymph nodes: No abnormal radiotracer accumulation within pelvic or
abdominal nodes.

Liver: No evidence of liver metastasis

Incidental CT finding: Several nonobstructing calculi of the LEFT
kidney.

SKELETON

No focal  activity to suggest skeletal metastasis.
IMPRESSION: 1. No evidence local prostate cancer recurrence in the prostatectomy
bed.
2. No evidence metastatic adenopathy in the pelvis or periaortic
retroperitoneum.
3. No evidence of visceral metastasis or skeletal metastasis.

## 2023-04-01 ENCOUNTER — Other Ambulatory Visit (HOSPITAL_COMMUNITY): Payer: Self-pay | Admitting: Urology

## 2023-04-01 DIAGNOSIS — C61 Malignant neoplasm of prostate: Secondary | ICD-10-CM

## 2023-04-14 ENCOUNTER — Encounter (HOSPITAL_COMMUNITY)
Admission: RE | Admit: 2023-04-14 | Discharge: 2023-04-14 | Disposition: A | Discharge status: Home / Self Care | Payer: Managed Care, Other (non HMO) | Source: Ambulatory Visit | Attending: Urology | Admitting: Urology

## 2023-04-14 DIAGNOSIS — C61 Malignant neoplasm of prostate: Secondary | ICD-10-CM | POA: Insufficient documentation

## 2023-04-14 MED ORDER — TECHNETIUM TC 99M MEDRONATE IV KIT
20.0000 | PACK | Freq: Once | INTRAVENOUS | Status: AC | PRN
  Administered 2023-04-14: 20 via INTRAVENOUS

## 2023-05-06 ENCOUNTER — Other Ambulatory Visit (HOSPITAL_COMMUNITY): Payer: Self-pay | Admitting: Urology

## 2023-05-06 DIAGNOSIS — C61 Malignant neoplasm of prostate: Secondary | ICD-10-CM

## 2023-05-23 ENCOUNTER — Encounter (HOSPITAL_COMMUNITY)
Admission: RE | Admit: 2023-05-23 | Discharge: 2023-05-23 | Disposition: A | Discharge status: Home / Self Care | Payer: Managed Care, Other (non HMO) | Source: Ambulatory Visit | Attending: Urology | Admitting: Urology

## 2023-05-23 DIAGNOSIS — C61 Malignant neoplasm of prostate: Secondary | ICD-10-CM | POA: Insufficient documentation

## 2023-05-23 MED ORDER — FLOTUFOLASTAT F 18 GALLIUM 296-5846 MBQ/ML IV SOLN
7.2000 | Freq: Once | INTRAVENOUS | Status: AC
  Administered 2023-05-23: 7.2 via INTRAVENOUS

## 2023-12-28 NOTE — Progress Notes (Signed)
 [facility] CONSULT NOTE  Patient Care Team: [name], NP as PCP - General (Nurse Practitioner) Grayce Buddle, RN Nurse Navigator as Registered Nurse (Medical Oncology)  ASSESSMENT & PLAN:  Blake Maxwell is a 53 y.o.male with history of prostate cancer, CKD being seen at Medical Oncology Clinic for prostate cancer.  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 Germline testing: will obtain Somatic testing: will obtain Treatment: ADT/ARPI  He was initially diagnosis of regional disease at age 62 enrolled into PROTEUS trial with 6 months of neoadjuvant ADT +/- apalutamide with GS 4+5 in one core, pretreatment PSA 56.3 followed by RP w/BPLND. Final pathology ypT3bN1Mx GS 4+5=9 w/focal +margin 1/13 LN positive completed 6 months of adjuvant ADT +/- apalutamide and adjuvant radiation in 06/2020. BCR in 03/2021 followed by oligometastases found on T7 and L2 s/p RT in 02/2022. PSA nadir was 0.28 in 06/2022 and continues to rise. PSA up to 19.5 in Oct 2024 and PET showed 8-10 bone metastases in 05/2023. He was started on ADT with apalutamide and reported neutropenia. WBC of 2.2 and 2.4 on 2 occasions in 11/2023. MCV was 94 and ANC was 1.2 and 1.4.  ALC 0.6 and 0.7.   Discussed his clinical course with him and his wife today.  The patient was counseled on the natural history of prostate cancer and the standard treatment options that are available for prostate cancer. Given his young age, aggressive prostate malignancy, though not considered curable, reasonable to consider an aggressive approach hoping to prolong disease survival.  He understands and agree with this approach.  Now he is having high-volume disease with multiple sites of metastases, >5.  I recommend triplet regimen with addition of docetaxel to ADT.  Will also switch to darolutamide.  We discussed potential side effects of ADT, darolutamide, and docetaxel.  We discussed addition of  docetaxel chemotherapy for up to six cycles.  Potential side effect may include but not limited to fatigue, neutropenia, febrile neutropenia, thrombocytopenia, anemia, alopecia, skin rash, loss of nail and potentially infection, hypersensitivity, fluid retention with edema, diarrhea, nausea, vomiting, mouth sores, liver enzyme elevation, neuropathy, muscle pain, joint pain, visual change and rarely severe allergic reaction, skin rash, severe infection resulting in sepsis, and potential death.  Discussed the importance of recognizing fever, signs of infection while on treatment. Proceed to ED for emergency evaluation in the setting of fever, infection. Severe infection resulting in sepsis can be life threatening and result in death if no treated early. 4% of deaths were reported in both group. After discussion he would like to proceed.  Will repeat lab today.  If persistent cytopenia, we will obtain further workup as well.  Assessment & Plan Malignant neoplasm of prostate (HCC) Will obtain for genetic testing. Teaching for docetaxel Continue ADT, switch to darolutamide Return for follow-up before cycle 1 of docetaxel Leukopenia, unspecified type Repeat labs today Obtain additional testing if needed and unresolved Normocytic anemia Repeat labs today  Orders Placed This Encounter  Procedures   CBC with Differential (Cancer Center Only)    Standing Status:   Future    Number of Occurrences:   1    Expiration Date:   12/28/2024   CMP (Cancer Center only)    Standing Status:   Future    Number of Occurrences:   1    Expiration Date:   12/28/2024   Testosterone     Standing Status:   Future    Number of Occurrences:  1    Expiration Date:   12/28/2024   Prostate-Specific AG, Serum    Standing Status:   Future    Number of Occurrences:   1    Expiration Date:   12/28/2024   Vitamin B12    Standing Status:   Future    Expiration Date:   12/29/2024   Folate    Standing Status:   Future     Expiration Date:   12/29/2024   Methylmalonic acid, serum    Standing Status:   Future    Expiration Date:   01/30/2024   Ferritin    Standing Status:   Future    Expiration Date:   12/29/2024   Ambulatory referral to Genetics    Referral Priority:   Routine    Referral Type:   Consultation    Referral Reason:   Specialty Services Required    Number of Visits Requested:   1    Supportive baseline bone mineral density study  calcium (1000-1200 mg daily from food and supplements) and vitamin D3 (1000 IU daily) Healthy lifestyle to prevent diabetes and CV disease Aggressive cardiovascular risk management Weight-bearing exercises (30 minutes per day) Limit alcohol consumption and avoid smoking   All questions were answered. The patient knows to call the clinic with any problems, questions or concerns. No barriers to learning was detected.  [name], MD 7/21/20258:42 AM  CHIEF COMPLAINTS/PURPOSE OF CONSULTATION:  Prostate cancer  HISTORY OF PRESENTING ILLNESS:  Blake Maxwell 53 y.o. male is here because of prostate cancer.  I have reviewed his chart and materials related to his cancer extensively and collaborated history with the patient. Summary of oncologic history is as above and below: Oncology History  Malignant neoplasm of prostate (HCC)  05/09/2018 Initial Diagnosis   Malignant neoplasm of prostate (HCC)  Biopsy on 03/28/18 Highest GS 4+5=9 GG5 in 1 cores. 4+3 in 7 cores. Rest 3+4. All cores + prostate cancer.    12/25/2018 Cancer Staging   Enrolled into PROTEUS trial with 6 months of neoadjuvant ADT +/- apalutamide.  Pretreatment PSA 56.3. 12/25/18 RP w/BPLND. ypT3bN1Mx GS 4+5=9 w/focal +margin 1/13 LN positive  Completed 6 months of adjuvant ADT +/- apa. Staging form: Prostate, AJCC 8th Edition - Pathologic stage from 12/25/2018: Stage IVA (pT3b, pN1, cM0, PSA: 56, Grade Group: 5) - Signed by Sherwood Rise, PA-C on 04/01/2020   04/2020 - 06/2020 Radiation  Therapy   Adjuvant radiation (Dr. Patrcia)   03/2021 Progression   BCR   07/2021 PET scan   PSMA PET ADDENDUM: In retrospect and comparison to PSMA PET scan of 01/31/2022, there is a radiotracer avid lesion in the L2 transverse process (RIGHT) with SUV max equal 11.7.   07/2021 PET scan   PSMA PET 1. New radiotracer avid skeletal metastasis in the T7 Vertebral body 2. Increased radiotracer activity at L2 transverse process skeletal metastasis. 3. No metastatic adenopathy in the pelvis periaortic retroperitoneum. 4. No visceral metastasis   07/20/2021 Tumor Marker   Psa 0.81   01/18/2022 Tumor Marker   PSA 1.83   03/08/2022 - 03/18/2022 Radiation Therapy   RT to oligometastatic lesions on T7 and L2   04/12/2022 Tumor Marker   PSA 1.26   07/06/2022 Tumor Marker   PSA 0.28   01/18/2023 Tumor Marker   PSA 12   03/21/2023 Tumor Marker   PSA 19.5   05/2023 Progression   PET noted progression of bone metastases.  1. Progression of skeletal radiotracer avid  prostate cancer metastatic disease. New lesions in the thoracic spine, ribs and pelvis. Approximately 8-10 radiotracer avid lesions. 2. No evidence of metastatic adenopathy or visceral metastasis   06/2023 -  Chemotherapy   Started ADT with apa Developed neutropenia.   06/28/2023 Tumor Marker   PSA 57.2   09/27/2023 Tumor Marker   PSA 10.1 T 24.7   01/09/2024 -  Chemotherapy   Patient is on Treatment Plan : PROSTATE Docetaxel (75) + Prednisone  q21d       MEDICAL HISTORY:  Past Medical History:  Diagnosis Date   Chronic kidney disease    History of kidney stones    Prostate cancer (HCC)     SURGICAL HISTORY: Past Surgical History:  Procedure Laterality Date   CYSTOSCOPY WITH RETROGRADE PYELOGRAM, URETEROSCOPY AND STENT PLACEMENT Right 05/27/2015   Procedure: CYSTOSCOPY WITH RIGHT  RETROGRADE PYELOGRAM, URETEROSCOPY AND STENT PLACEMENT;  Surgeon: Donnice Brooks, MD;  Location: [facility];  Service: Urology;   Laterality: Right;   HOLMIUM LASER APPLICATION Right 05/27/2015   Procedure: HOLMIUM LASER APPLICATION;  Surgeon: Donnice Brooks, MD;  Location: [facility];  Service: Urology;  Laterality: Right;   LYMPHADENECTOMY Bilateral 12/25/2018   Procedure: LYMPHADENECTOMY;  Surgeon: Renda Glance, MD;  Location: [facility];  Service: Urology;  Laterality: Bilateral;   NO PAST SURGERIES     ROBOT ASSISTED LAPAROSCOPIC RADICAL PROSTATECTOMY N/A 12/25/2018   Procedure: XI ROBOTIC ASSISTED LAPAROSCOPIC RADICAL PROSTATECTOMY LEVEL 3;  Surgeon: Renda Glance, MD;  Location: [facility];  Service: Urology;  Laterality: N/A;  NEEDS 210 MIN FOR ALL PROCEDURES    SOCIAL HISTORY: Social History   Socioeconomic History   Marital status: Married    Spouse name: Lavona   Number of children: 2   Years of education: Not on file   Highest education level: Not on file  Occupational History   Occupation: truck driver  Tobacco Use   Smoking status: Never   Smokeless tobacco: Never  Vaping Use   Vaping status: Never Used  Substance and Sexual Activity   Alcohol use: No   Drug use: No   Sexual activity: Not Currently    Comment: s/p prostatectomy  Other Topics Concern   Not on file  Social History Narrative   Not on file   Social Drivers of Health   Financial Resource Strain: Not on file  Food Insecurity: No Food Insecurity (12/29/2023)   Hunger Vital Sign    Worried About Running Out of Food in the Last Year: Never true    Ran Out of Food in the Last Year: Never true  Transportation Needs: No Transportation Needs (12/29/2023)   PRAPARE - Administrator, Civil Service (Medical): No    Lack of Transportation (Non-Medical): No  Physical Activity: Not on file  Stress: Not on file  Social Connections: Not on file  Intimate Partner Violence: Not At Risk (12/29/2023)   Humiliation, Afraid, Rape, and Kick questionnaire    Fear of Current or Ex-Partner: No    Emotionally Abused: No    Physically Abused:  No    Sexually Abused: No    FAMILY HISTORY: Family History  Problem Relation Age of Onset   Leukemia Maternal Aunt    Bladder Cancer Maternal Uncle    Breast cancer Maternal Grandmother    Prostate cancer Neg Hx     ALLERGIES:  has no known allergies.  MEDICATIONS:  No current outpatient medications on file.   No current facility-administered medications for this visit.  REVIEW OF SYSTEMS:   All relevant systems were reviewed with the patient and are negative.  PHYSICAL EXAMINATION: ECOG PERFORMANCE STATUS: 0 - Asymptomatic  Vitals:   12/29/23 1130  BP: 117/71  Pulse: (!) 55  Resp: 20  Temp: (!) 97.5 F (36.4 C)  SpO2: 100%   Filed Weights   12/29/23 1130  Weight: 207 lb 8 oz (94.1 kg)    GENERAL: alert, no distress and comfortable SKIN: skin color is normal, no jaundice, rashes EYES: sclera clear OROPHARYNX: no exudate, no erythema NECK: supple LYMPH:  no palpable lymphadenopathy in the cervical, axillary regions LUNGS: Effort normal, no respiratory distress.  Clear to auscultation bilaterally HEART: regular rate & rhythm and no lower extremity edema ABDOMEN: soft, non-tender and nondistended Musculoskeletal: no point tenderness NEURO: no focal motor/sensory deficits  LABORATORY DATA:  I have reviewed the results of PSA.  RADIOGRAPHIC STUDIES: I have personally reviewed the radiological images as listed and agreed with the findings in the report.

## 2023-12-28 NOTE — Assessment & Plan Note (Addendum)
 Will obtain for genetic testing. Teaching for docetaxel Continue ADT, switch to darolutamide Return for follow-up before cycle 1 of docetaxel

## 2023-12-29 ENCOUNTER — Inpatient Hospital Stay

## 2023-12-29 VITALS — BP 117/71 | HR 55 | Temp 97.5°F | Resp 20 | Wt 207.5 lb

## 2023-12-29 DIAGNOSIS — N189 Chronic kidney disease, unspecified: Secondary | ICD-10-CM | POA: Insufficient documentation

## 2023-12-29 DIAGNOSIS — D649 Anemia, unspecified: Secondary | ICD-10-CM | POA: Diagnosis not present

## 2023-12-29 DIAGNOSIS — Z803 Family history of malignant neoplasm of breast: Secondary | ICD-10-CM | POA: Diagnosis not present

## 2023-12-29 DIAGNOSIS — Z8052 Family history of malignant neoplasm of bladder: Secondary | ICD-10-CM | POA: Insufficient documentation

## 2023-12-29 DIAGNOSIS — C7951 Secondary malignant neoplasm of bone: Secondary | ICD-10-CM | POA: Insufficient documentation

## 2023-12-29 DIAGNOSIS — C61 Malignant neoplasm of prostate: Secondary | ICD-10-CM

## 2023-12-29 DIAGNOSIS — D72819 Decreased white blood cell count, unspecified: Secondary | ICD-10-CM | POA: Insufficient documentation

## 2023-12-29 DIAGNOSIS — Z5111 Encounter for antineoplastic chemotherapy: Secondary | ICD-10-CM | POA: Diagnosis present

## 2023-12-29 DIAGNOSIS — Z806 Family history of leukemia: Secondary | ICD-10-CM | POA: Insufficient documentation

## 2023-12-29 DIAGNOSIS — Z5189 Encounter for other specified aftercare: Secondary | ICD-10-CM | POA: Insufficient documentation

## 2023-12-29 LAB — CBC WITH DIFFERENTIAL (CANCER CENTER ONLY)
Abs Immature Granulocytes: 0.01 K/uL (ref 0.00–0.07)
Basophils Absolute: 0 K/uL (ref 0.0–0.1)
Basophils Relative: 0 %
Eosinophils Absolute: 0 K/uL (ref 0.0–0.5)
Eosinophils Relative: 1 %
HCT: 36.2 % — ABNORMAL LOW (ref 39.0–52.0)
Hemoglobin: 12.9 g/dL — ABNORMAL LOW (ref 13.0–17.0)
Immature Granulocytes: 0 %
Lymphocytes Relative: 28 %
Lymphs Abs: 0.7 K/uL (ref 0.7–4.0)
MCH: 31.9 pg (ref 26.0–34.0)
MCHC: 35.6 g/dL (ref 30.0–36.0)
MCV: 89.4 fL (ref 80.0–100.0)
Monocytes Absolute: 0.3 K/uL (ref 0.1–1.0)
Monocytes Relative: 12 %
Neutro Abs: 1.5 K/uL — ABNORMAL LOW (ref 1.7–7.7)
Neutrophils Relative %: 59 %
Platelet Count: 148 K/uL — ABNORMAL LOW (ref 150–400)
RBC: 4.05 MIL/uL — ABNORMAL LOW (ref 4.22–5.81)
RDW: 11.9 % (ref 11.5–15.5)
Smear Review: NORMAL
WBC Count: 2.5 K/uL — ABNORMAL LOW (ref 4.0–10.5)
nRBC: 0 % (ref 0.0–0.2)

## 2023-12-29 LAB — CMP (CANCER CENTER ONLY)
ALT: 17 U/L (ref 0–44)
AST: 20 U/L (ref 15–41)
Albumin: 4 g/dL (ref 3.5–5.0)
Alkaline Phosphatase: 55 U/L (ref 38–126)
Anion gap: 5 (ref 5–15)
BUN: 15 mg/dL (ref 6–20)
CO2: 26 mmol/L (ref 22–32)
Calcium: 9.3 mg/dL (ref 8.9–10.3)
Chloride: 107 mmol/L (ref 98–111)
Creatinine: 1.04 mg/dL (ref 0.61–1.24)
GFR, Estimated: >60 mL/min (ref >60)
Glucose, Bld: 89 mg/dL (ref 70–99)
Potassium: 4.7 mmol/L (ref 3.5–5.1)
Sodium: 138 mmol/L (ref 135–145)
Total Bilirubin: 0.4 mg/dL (ref 0.0–1.2)
Total Protein: 6.5 g/dL (ref 6.5–8.1)

## 2023-12-29 NOTE — Progress Notes (Signed)
START ON PATHWAY REGIMEN - Prostate     Darolutamide: A cycle is every 28 days:     Darolutamide    Docetaxel cycles 1 through 6: A cycle is every 21 days:     Docetaxel   **Always confirm dose/schedule in your pharmacy ordering system**  Patient Characteristics: Adenocarcinoma, Recurrent/New Systemic Disease (Including Biochemical Recurrence), Non-Castrate, M1 Histology: Adenocarcinoma Therapeutic Status: Recurrent/New Systemic Disease (Including Biochemical Recurrence) Intent of Therapy: Non-Curative / Palliative Intent, Discussed with Patient 

## 2023-12-30 LAB — TESTOSTERONE: Testosterone: 50 ng/dL — ABNORMAL LOW (ref 264–916)

## 2023-12-30 LAB — PROSTATE-SPECIFIC AG, SERUM (LABCORP): Prostate Specific Ag, Serum: <0.1 ng/mL (ref 0.0–4.0)

## 2023-12-31 ENCOUNTER — Other Ambulatory Visit: Payer: Self-pay

## 2024-01-02 ENCOUNTER — Inpatient Hospital Stay

## 2024-01-02 ENCOUNTER — Other Ambulatory Visit: Payer: Self-pay | Admitting: Genetic Counselor

## 2024-01-02 DIAGNOSIS — Z1379 Encounter for other screening for genetic and chromosomal anomalies: Secondary | ICD-10-CM

## 2024-01-02 DIAGNOSIS — Z5111 Encounter for antineoplastic chemotherapy: Secondary | ICD-10-CM | POA: Diagnosis not present

## 2024-01-02 DIAGNOSIS — D649 Anemia, unspecified: Secondary | ICD-10-CM

## 2024-01-02 DIAGNOSIS — D72819 Decreased white blood cell count, unspecified: Secondary | ICD-10-CM

## 2024-01-02 LAB — FERRITIN: Ferritin: 579 ng/mL — ABNORMAL HIGH (ref 24–336)

## 2024-01-02 LAB — GENETIC SCREENING ORDER

## 2024-01-02 LAB — FOLATE: Folate: 12.1 ng/mL (ref >5.9)

## 2024-01-02 LAB — VITAMIN B12: Vitamin B-12: 1230 pg/mL — ABNORMAL HIGH (ref 180–914)

## 2024-01-02 MED ORDER — DEXAMETHASONE 4 MG PO TABS: ORAL_TABLET | ORAL | 1 refills | Status: DC

## 2024-01-02 MED ORDER — PREDNISONE 5 MG PO TABS: 5.0000 mg | ORAL_TABLET | Freq: Every day | ORAL | 1 refills | Status: DC

## 2024-01-02 MED ORDER — ONDANSETRON HCL 8 MG PO TABS: 8.0000 mg | ORAL_TABLET | Freq: Three times a day (TID) | ORAL | 1 refills | Status: AC | PRN

## 2024-01-02 MED ORDER — PROCHLORPERAZINE MALEATE 10 MG PO TABS: 10.0000 mg | ORAL_TABLET | Freq: Four times a day (QID) | ORAL | 1 refills | Status: AC | PRN

## 2024-01-02 NOTE — Progress Notes (Addendum)
 PATIENT NAVIGATOR PROGRESS NOTE  Name: Blake Maxwell Date: 01/02/2024 MRN: [id number]  DOB: 10-Nov-1970   Reason for visit:   mHSPC with high volume disease   Recommendations:   Continue ADT ([facility]) Eligard 45mg  06/17/2023; Next Injection 7/30  Genetic Testing (Will be obtained today at lab appointment)  Darolutamide  ([facility])  Taxotere  (Pending auth, scheduling) Patient education scheduled for 01/06/24.

## 2024-01-02 NOTE — Addendum Note (Signed)
 Addended by: TINA HUDSON on: 01/02/2024 05:38 PM   Modules accepted: Orders

## 2024-01-03 ENCOUNTER — Ambulatory Visit (HOSPITAL_BASED_OUTPATIENT_CLINIC_OR_DEPARTMENT_OTHER): Admitting: Genetic Counselor

## 2024-01-03 ENCOUNTER — Other Ambulatory Visit: Payer: Self-pay

## 2024-01-03 ENCOUNTER — Encounter: Payer: Self-pay | Admitting: Genetic Counselor

## 2024-01-03 DIAGNOSIS — Z803 Family history of malignant neoplasm of breast: Secondary | ICD-10-CM | POA: Diagnosis not present

## 2024-01-03 DIAGNOSIS — C61 Malignant neoplasm of prostate: Secondary | ICD-10-CM | POA: Diagnosis not present

## 2024-01-03 DIAGNOSIS — Z1371 Encounter for nonprocreative screening for genetic disease carrier status: Secondary | ICD-10-CM | POA: Diagnosis not present

## 2024-01-03 NOTE — Progress Notes (Signed)
 REFERRING PROVIDER: Tina Hudson  PRIMARY PROVIDER:  [name], NP  PRIMARY REASON FOR VISIT:  Encounter Diagnoses  Name Primary?   Malignant neoplasm of prostate (HCC) Yes   Family history of breast cancer    HISTORY OF PRESENT ILLNESS:   Blake Maxwell, a 53 y.o. male, was seen for a [facility] cancer genetics consultation at the request of Dr. Tina due to a personal and family history of cancer.  Blake Maxwell presents to clinic today to discuss the possibility of a hereditary predisposition to cancer, to discuss genetic testing, and to further clarify his future cancer risks, as well as potential cancer risks for family members.   Blake Maxwell was first diagnosed with prostate cancer in 2019 at age 71 (Gleason: 9). In 2024, at age 19, he was found to have bone metastases.  I connected with Blake Maxwell on 01/03/2024 at 11:00AM EDT by telephone and verified that I am speaking with the correct person using two identifiers.   Patient location: [place], [place] Provider location: [facility]   CANCER HISTORY:  Oncology History  Malignant neoplasm of prostate [facility])  05/09/2018 Initial Diagnosis   Malignant neoplasm of prostate (HCC)  Biopsy on 03/28/18 Highest GS 4+5=9 GG5 in 1 cores. 4+3 in 7 cores. Rest 3+4. All cores + prostate cancer.    12/25/2018 Cancer Staging   Enrolled into PROTEUS trial with 6 months of neoadjuvant ADT +/- apalutamide.  Pretreatment PSA 56.3. 12/25/18 RP w/BPLND. ypT3bN1Mx GS 4+5=9 w/focal +margin 1/13 LN positive  Completed 6 months of adjuvant ADT +/- apa. Staging form: Prostate, AJCC 8th Edition - Pathologic stage from 12/25/2018: Stage IVA (pT3b, pN1, cM0, PSA: 56, Grade Group: 5) - Signed by Sherwood Rise, PA-C on 04/01/2020   04/2020 - 06/2020 Radiation Therapy   Adjuvant radiation (Dr. Patrcia)   03/2021 Progression   BCR   07/2021 PET scan   PSMA PET ADDENDUM: In retrospect and comparison to PSMA PET scan of 01/31/2022, there is  a radiotracer avid lesion in the L2 transverse process (RIGHT) with SUV max equal 11.7.   07/2021 PET scan   PSMA PET 1. New radiotracer avid skeletal metastasis in the T7 Vertebral body 2. Increased radiotracer activity at L2 transverse process skeletal metastasis. 3. No metastatic adenopathy in the pelvis periaortic retroperitoneum. 4. No visceral metastasis   07/20/2021 Tumor Marker   Psa 0.81   01/18/2022 Tumor Marker   PSA 1.83   03/08/2022 - 03/18/2022 Radiation Therapy   RT to oligometastatic lesions on T7 and L2   04/12/2022 Tumor Marker   PSA 1.26   07/06/2022 Tumor Marker   PSA 0.28   01/18/2023 Tumor Marker   PSA 12   03/21/2023 Tumor Marker   PSA 19.5   05/2023 Progression   PET noted progression of bone metastases.  1. Progression of skeletal radiotracer avid prostate cancer metastatic disease. New lesions in the thoracic spine, ribs and pelvis. Approximately 8-10 radiotracer avid lesions. 2. No evidence of metastatic adenopathy or visceral metastasis   06/2023 -  Chemotherapy   Started ADT with apa Developed neutropenia.   06/28/2023 Tumor Marker   PSA 57.2   09/27/2023 Tumor Marker   PSA 10.1 T 24.7   01/09/2024 -  Chemotherapy   Patient is on Treatment Plan : PROSTATE Docetaxel (75) + Prednisone  q21d      Past Medical History:  Diagnosis Date   Chronic kidney disease    History of kidney stones    Prostate cancer (  HCC)     Past Surgical History:  Procedure Laterality Date   CYSTOSCOPY WITH RETROGRADE PYELOGRAM, URETEROSCOPY AND STENT PLACEMENT Right 05/27/2015   Procedure: CYSTOSCOPY WITH RIGHT  RETROGRADE PYELOGRAM, URETEROSCOPY AND STENT PLACEMENT;  Surgeon: Donnice Brooks, MD;  Location: [facility];  Service: Urology;  Laterality: Right;   HOLMIUM LASER APPLICATION Right 05/27/2015   Procedure: HOLMIUM LASER APPLICATION;  Surgeon: Donnice Brooks, MD;  Location: [facility];  Service: Urology;  Laterality: Right;   LYMPHADENECTOMY Bilateral 12/25/2018    Procedure: LYMPHADENECTOMY;  Surgeon: Renda Glance, MD;  Location: [facility];  Service: Urology;  Laterality: Bilateral;   NO PAST SURGERIES     ROBOT ASSISTED LAPAROSCOPIC RADICAL PROSTATECTOMY N/A 12/25/2018   Procedure: XI ROBOTIC ASSISTED LAPAROSCOPIC RADICAL PROSTATECTOMY LEVEL 3;  Surgeon: Renda Glance, MD;  Location: [facility];  Service: Urology;  Laterality: N/A;  NEEDS 210 MIN FOR ALL PROCEDURES    Social History   Socioeconomic History   Marital status: Married    Spouse name: Lavona   Number of children: 2   Years of education: Not on file   Highest education level: Not on file  Occupational History   Occupation: truck driver  Tobacco Use   Smoking status: Never   Smokeless tobacco: Never  Vaping Use   Vaping status: Never Used  Substance and Sexual Activity   Alcohol use: No   Drug use: No   Sexual activity: Not Currently    Comment: s/p prostatectomy  Other Topics Concern   Not on file  Social History Narrative   Not on file   Social Drivers of Health   Financial Resource Strain: Not on file  Food Insecurity: No Food Insecurity (12/29/2023)   Hunger Vital Sign    Worried About Running Out of Food in the Last Year: Never true    Ran Out of Food in the Last Year: Never true  Transportation Needs: No Transportation Needs (12/29/2023)   PRAPARE - Administrator, Civil Service (Medical): No    Lack of Transportation (Non-Medical): No  Physical Activity: Not on file  Stress: Not on file  Social Connections: Not on file     FAMILY HISTORY:  We obtained a detailed, 4-generation family history.  Significant diagnoses are listed below: Family History  Problem Relation Age of Onset   Leukemia Maternal Aunt        dx. >50   Bladder Cancer Maternal Uncle        dx. >50   Cancer Paternal Uncle        unknown type   Breast cancer Maternal Grandmother        dx. >50   Cancer Paternal Cousin        unknown type   Prostate cancer Neg Hx         Blake Maxwell is unaware of previous family history of genetic testing for hereditary cancer risks. There is no reported Ashkenazi Jewish ancestry.   GENETIC COUNSELING ASSESSMENT: Blake Maxwell is a 53 y.o. male with a personal and family history of cancer which is somewhat suggestive of a hereditary predisposition to cancer given his metastatic prostate cancer. We, therefore, discussed and recommended the following at today's visit.   DISCUSSION: We discussed that 5 - 10% of cancer is hereditary, with most cases of prostate cancer associated with BRCA1/2.  There are other genes that can be associated with hereditary prostate cancer syndromes.  We discussed that testing is beneficial for several reasons including knowing how  to follow individuals after completing their treatment, identifying whether potential treatment options would be beneficial, and understanding if other family members could be at risk for cancer and allowing them to undergo genetic testing.   We reviewed the characteristics, features and inheritance patterns of hereditary cancer syndromes. We also discussed genetic testing, including the appropriate family members to test, the process of testing, insurance coverage and turn-around-time for results. We discussed the implications of a negative, positive, carrier and/or variant of uncertain significant result. We recommended Blake Maxwell pursue genetic testing for a panel that includes genes associated with cancer.   Blake Maxwell was offered a common hereditary cancer panel (40 genes) and an expanded pan-cancer panel (77 genes). Blake Maxwell was informed of the benefits and limitations of each panel, including that expanded pan-cancer panels contain genes that do not have clear management guidelines at this point in time.  We also discussed that as the number of genes included on a panel increases, the chances of variants of uncertain significance increases. After considering the benefits  and limitations of each gene panel, Blake Maxwell elected to have [facility] CancerNext Panel.  The [facility] CancerNext+RNAinsight Panel includes sequencing, rearrangement analysis, and RNA analysis for the following 40 genes: APC, ATM, BAP1, BARD1, BMPR1A, BRCA1, BRCA2, BRIP1, CDH1, CDKN2A, CHEK2, FH, FLCN, MET, MLH1, MSH2, MSH6, MUTYH, NF1, NTHL1, PALB2, PMS2, PTEN, RAD51C, RAD51D, RPS20, SMAD4, STK11, TP53, TSC1, TSC2, and VHL (sequencing and deletion/duplication); AXIN2, HOXB13, MBD4, MSH3, POLD1 and POLE (sequencing only); EPCAM and GREM1 (deletion/duplication only).  Based on Blake Maxwell personal and family history of cancer, he meets medical criteria for genetic testing. Despite that he meets criteria, he may still have an out of pocket cost. We discussed that if his out of pocket cost for testing is over $100, the laboratory should contact them to discuss self-pay prices, patient pay assistance programs, if applicable, and other billing options.  PLAN: After considering the risks, benefits, and limitations, Blake Maxwell provided informed consent to pursue genetic testing and the blood sample was sent to [facility] for analysis of the CancerNext Panel. Results should be available within approximately 2-3 weeks' time, at which point they will be disclosed by telephone to Blake Maxwell, as will any additional recommendations warranted by these results. Blake Maxwell will receive a summary of his genetic counseling visit and a copy of his results once available. This information will also be available in Epic.   Lastly, we encouraged Blake Maxwell to remain in contact with cancer genetics annually so that we can continuously update the family history and inform him of any changes in cancer genetics and testing that may be of benefit for this family.   Blake Maxwell questions were answered to his satisfaction today. Our contact information was provided should additional questions or concerns arise. Thank you for the  referral and allowing us  to share in the care of your patient.   Urban Naval, MS, [facility] Genetic Counselor [email or web address] (P) [phone]  40 minutes were spent on the date of the encounter in service to the patient including preparation, telephone consultation, documentation and care coordination. The patient was seen alone.  Drs. Gudena and/or Lanny were available to discuss this case as needed.   _______________________________________________________________________ For Office Staff:  Number of people involved in session: 1 Was an Intern/ student involved with case: no

## 2024-01-04 ENCOUNTER — Other Ambulatory Visit: Payer: Self-pay

## 2024-01-05 NOTE — Progress Notes (Signed)
 Labs faxed successfully to [facility].

## 2024-01-06 ENCOUNTER — Inpatient Hospital Stay

## 2024-01-06 DIAGNOSIS — C61 Malignant neoplasm of prostate: Secondary | ICD-10-CM

## 2024-01-06 NOTE — Progress Notes (Signed)
 Introduced myself to the patient as the prostate nurse navigator.  No barriers to care identified at this time.  He is here today to have chemotherapy education, and is scheduled for his first infusion on 7/28.  Patient will call if any barriers or questions arise.  Provided my direct number.

## 2024-01-07 NOTE — Progress Notes (Unsigned)
 [facility] OFFICE PROGRESS NOTE  Patient Care Maxwell: Blake Gee, NP as PCP - General (Nurse Practitioner) Vertell Pont, RN as Oncology Nurse Navigator  Son is a 53 y.o.male with history of prostate cancer, CKD being seen at Medical Oncology Clinic for prostate cancer.   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 Germline testing: will obtain Somatic testing: will obtain Treatment: ADT/ARPI  He was initially diagnosis of regional disease at age 69 enrolled into PROTEUS trial with 6 months of neoadjuvant ADT +/- apalutamide with GS 4+5 in one core, pretreatment PSA 56.3 followed by RP w/BPLND. Final pathology ypT3bN1Mx GS 4+5=9 w/focal +margin 1/13 LN positive completed 6 months of adjuvant ADT +/- apalutamide and adjuvant radiation in 06/2020. BCR in 03/2021 followed by oligometastases found on T7 and L2 s/p RT in 02/2022. PSA nadir was 0.28 in 06/2022 and continues to Maxwell. PSA up to 19.5 in Oct 2024 and PET showed 8-10 bone metastases in 05/2023. He was started on ADT with apalutamide and reported neutropenia. WBC of 2.2 and 2.4 on 2 occasions in 11/2023. MCV was 94 and ANC was 1.2 and 1.4.  ALC 0.6 and 0.7   Will start docetaxel  today.  May start darolutamide  anytime. Assessment & Plan Malignant neoplasm of prostate (HCC) C1 of docetaxel  Continue ADT, switch to darolutamide  Return for follow-up before cycle 2 of docetaxel  At risk for side effect of medication calcium (1000-1200 mg daily from food and supplements) and vitamin D3 (1000 IU daily) Routine dental care Healthy lifestyle to prevent diabetes and CV disease Weight-bearing exercises (30 minutes per day) Limit alcohol consumption and avoid smoking May use ginger ale, ginger tea for nausea prevention.    Blake JAYSON Chihuahua, MD  INTERVAL HISTORY: Patient returns for follow-up.  Overall feeling well.  Ready to start treatment.  No symptoms.  Oncology History   Malignant neoplasm of prostate (HCC)  05/09/2018 Initial Diagnosis   Malignant neoplasm of prostate (HCC)  Biopsy on 03/28/18 Highest GS 4+5=9 GG5 in 1 cores. 4+3 in 7 cores. Rest 3+4. All cores + prostate cancer.    12/25/2018 Cancer Staging   Enrolled into PROTEUS trial with 6 months of neoadjuvant ADT +/- apalutamide.  Pretreatment PSA 56.3. 12/25/18 RP w/BPLND. ypT3bN1Mx GS 4+5=9 w/focal +margin 1/13 LN positive  Completed 6 months of adjuvant ADT +/- apa. Staging form: Prostate, AJCC 8th Edition - Pathologic stage from 12/25/2018: Stage IVA (pT3b, pN1, cM0, PSA: 56, Grade Group: 5) - Signed by Blake Rise, PA-C on 04/01/2020   04/2020 - 06/2020 Radiation Therapy   Adjuvant radiation (Dr. Patrcia)   03/2021 Progression   BCR   07/2021 PET scan   PSMA PET ADDENDUM: In retrospect and comparison to PSMA PET scan of 01/31/2022, there is a radiotracer avid lesion in the L2 transverse process (RIGHT) with SUV max equal 11.7.   07/2021 PET scan   PSMA PET 1. New radiotracer avid skeletal metastasis in the T7 Vertebral body 2. Increased radiotracer activity at L2 transverse process skeletal metastasis. 3. No metastatic adenopathy in the pelvis periaortic retroperitoneum. 4. No visceral metastasis   07/20/2021 Tumor Marker   Psa 0.81   01/18/2022 Tumor Marker   PSA 1.83   03/08/2022 - 03/18/2022 Radiation Therapy   RT to oligometastatic lesions on T7 and L2   04/12/2022 Tumor Marker   PSA 1.26   07/06/2022 Tumor Marker   PSA 0.28   01/18/2023 Tumor Marker   PSA 12  03/21/2023 Tumor Marker   PSA 19.5   05/2023 Progression   PET noted progression of bone metastases.  1. Progression of skeletal radiotracer avid prostate cancer metastatic disease. New lesions in the thoracic spine, ribs and pelvis. Approximately 8-10 radiotracer avid lesions. 2. No evidence of metastatic adenopathy or visceral metastasis   06/2023 -  Chemotherapy   Started ADT with apa Developed  neutropenia.   06/28/2023 Tumor Marker   PSA 57.2   09/27/2023 Tumor Marker   PSA 10.1 T 24.7   01/09/2024 -  Chemotherapy   Patient is on Treatment Plan : PROSTATE Docetaxel  (75) + Prednisone  q21d        PHYSICAL EXAMINATION: ECOG PERFORMANCE STATUS: 0 - Asymptomatic  Vitals:   01/09/24 0958  BP: 113/69  Pulse: 60  Resp: 17  Temp: 97.9 F (36.6 C)  SpO2: 100%   Filed Weights   01/09/24 0958  Weight: 207 lb 4.8 oz (94 kg)   GENERAL: alert, no distress and comfortable LUNGS: clear to auscultation and percussion with normal breathing effort HEART: regular rate & rhythm  ABDOMEN: abdomen soft, non-tender and nondistended. Musculoskeletal: no edema  Relevant data reviewed during this visit included labs.

## 2024-01-09 ENCOUNTER — Inpatient Hospital Stay

## 2024-01-09 ENCOUNTER — Inpatient Hospital Stay (HOSPITAL_BASED_OUTPATIENT_CLINIC_OR_DEPARTMENT_OTHER)

## 2024-01-09 VITALS — BP 113/69 | HR 60 | Temp 97.9°F | Resp 17 | Ht 69.0 in | Wt 207.3 lb

## 2024-01-09 VITALS — BP 125/72 | HR 49 | Resp 16

## 2024-01-09 DIAGNOSIS — C61 Malignant neoplasm of prostate: Secondary | ICD-10-CM

## 2024-01-09 DIAGNOSIS — Z5111 Encounter for antineoplastic chemotherapy: Secondary | ICD-10-CM | POA: Diagnosis not present

## 2024-01-09 DIAGNOSIS — Z9189 Other specified personal risk factors, not elsewhere classified: Secondary | ICD-10-CM | POA: Insufficient documentation

## 2024-01-09 LAB — CBC WITH DIFFERENTIAL (CANCER CENTER ONLY)
Abs Immature Granulocytes: 0.01 K/uL (ref 0.00–0.07)
Basophils Absolute: 0 K/uL (ref 0.0–0.1)
Basophils Relative: 0 %
Eosinophils Absolute: 0 K/uL (ref 0.0–0.5)
Eosinophils Relative: 0 %
HCT: 36.1 % — ABNORMAL LOW (ref 39.0–52.0)
Hemoglobin: 13.2 g/dL (ref 13.0–17.0)
Immature Granulocytes: 0 %
Lymphocytes Relative: 13 %
Lymphs Abs: 0.7 K/uL (ref 0.7–4.0)
MCH: 32.4 pg (ref 26.0–34.0)
MCHC: 36.6 g/dL — ABNORMAL HIGH (ref 30.0–36.0)
MCV: 88.5 fL (ref 80.0–100.0)
Monocytes Absolute: 0.5 K/uL (ref 0.1–1.0)
Monocytes Relative: 10 %
Neutro Abs: 4.3 K/uL (ref 1.7–7.7)
Neutrophils Relative %: 77 %
Platelet Count: 157 K/uL (ref 150–400)
RBC: 4.08 MIL/uL — ABNORMAL LOW (ref 4.22–5.81)
RDW: 12 % (ref 11.5–15.5)
WBC Count: 5.6 K/uL (ref 4.0–10.5)
nRBC: 0 % (ref 0.0–0.2)

## 2024-01-09 LAB — CMP (CANCER CENTER ONLY)
ALT: 16 U/L (ref 0–44)
AST: 16 U/L (ref 15–41)
Albumin: 4.1 g/dL (ref 3.5–5.0)
Alkaline Phosphatase: 62 U/L (ref 38–126)
Anion gap: 6 (ref 5–15)
BUN: 17 mg/dL (ref 6–20)
CO2: 25 mmol/L (ref 22–32)
Calcium: 9.5 mg/dL (ref 8.9–10.3)
Chloride: 109 mmol/L (ref 98–111)
Creatinine: 0.93 mg/dL (ref 0.61–1.24)
GFR, Estimated: >60 mL/min (ref >60)
Glucose, Bld: 94 mg/dL (ref 70–99)
Potassium: 3.9 mmol/L (ref 3.5–5.1)
Sodium: 140 mmol/L (ref 135–145)
Total Bilirubin: 0.4 mg/dL (ref 0.0–1.2)
Total Protein: 6.8 g/dL (ref 6.5–8.1)

## 2024-01-09 MED ORDER — SODIUM CHLORIDE 0.9 % IV SOLN
75.0000 mg/m2 | Freq: Once | INTRAVENOUS | Status: AC
  Administered 2024-01-09: 160 mg via INTRAVENOUS
  Filled 2024-01-09: qty 16

## 2024-01-09 MED ORDER — SODIUM CHLORIDE 0.9 % IV SOLN: INTRAVENOUS | Status: DC

## 2024-01-09 MED ORDER — DEXAMETHASONE SODIUM PHOSPHATE 10 MG/ML IJ SOLN
10.0000 mg | Freq: Once | INTRAMUSCULAR | Status: AC
  Administered 2024-01-09: 10 mg via INTRAVENOUS
  Filled 2024-01-09: qty 1

## 2024-01-09 NOTE — Assessment & Plan Note (Signed)
 calcium (1000-1200 mg daily from food and supplements) and vitamin D3 (1000 IU daily) Routine dental care Healthy lifestyle to prevent diabetes and CV disease Weight-bearing exercises (30 minutes per day) Limit alcohol consumption and avoid smoking May use ginger ale, ginger tea for nausea prevention.

## 2024-01-09 NOTE — Assessment & Plan Note (Addendum)
 C1 of docetaxel  Continue ADT, switch to darolutamide  Return for follow-up before cycle 2 of docetaxel 

## 2024-01-09 NOTE — Patient Instructions (Signed)
 [facility] - A DEPT OF [facility]  Discharge Instructions: Thank you for choosing [facility] to provide your oncology and hematology care.   If you have a lab appointment with the Cancer Center, please go directly to the Cancer Center and check in at the registration area.   Wear comfortable clothing and clothing appropriate for easy access to any Portacath or PICC line.   We strive to give you quality time with your provider. You may need to reschedule your appointment if you arrive late (15 or more minutes).  Arriving late affects you and other patients whose appointments are after yours.  Also, if you miss three or more appointments without notifying the office, you may be dismissed from the clinic at the provider's discretion.      For prescription refill requests, have your pharmacy contact our office and allow 72 hours for refills to be completed.    Today you received the following chemotherapy and/or immunotherapy agents: Docetaxel      To help prevent nausea and vomiting after your treatment, we encourage you to take your nausea medication as directed.  BELOW ARE SYMPTOMS THAT SHOULD BE REPORTED IMMEDIATELY: *FEVER GREATER THAN 100.4 F (38 C) OR HIGHER *CHILLS OR SWEATING *NAUSEA AND VOMITING THAT IS NOT CONTROLLED WITH YOUR NAUSEA MEDICATION *UNUSUAL SHORTNESS OF BREATH *UNUSUAL BRUISING OR BLEEDING *URINARY PROBLEMS (pain or burning when urinating, or frequent urination) *BOWEL PROBLEMS (unusual diarrhea, constipation, pain near the anus) TENDERNESS IN MOUTH AND THROAT WITH OR WITHOUT PRESENCE OF ULCERS (sore throat, sores in mouth, or a toothache) UNUSUAL RASH, SWELLING OR PAIN  UNUSUAL VAGINAL DISCHARGE OR ITCHING   Items with * indicate a potential emergency and should be followed up as soon as possible or go to the Emergency Department if any problems should occur.  Please show the CHEMOTHERAPY ALERT CARD or IMMUNOTHERAPY  ALERT CARD at check-in to the Emergency Department and triage nurse.  Should you have questions after your visit or need to cancel or reschedule your appointment, please contact [facility] - A DEPT OF [facility]  Dept: [phone]  and follow the prompts.  Office hours are 8:00 a.m. to 4:30 p.m. Monday - Friday. Please note that voicemails left after 4:00 p.m. may not be returned until the following business day.  We are closed weekends and major holidays. You have access to a nurse at all times for urgent questions. Please call the main number to the clinic Dept: [phone] and follow the prompts.   For any non-urgent questions, you may also contact your provider using MyChart. We now offer e-Visits for anyone 59 and older to request care online for non-urgent symptoms. For details visit [email or web address].   Also download the MyChart app! Go to the app store, search "MyChart", open the app, select [place], and log in with your MyChart username and password.  Docetaxel Injection What is this medication? DOCETAXEL (doe se TAX el) treats some types of cancer. It works by slowing down the growth of cancer cells. This medicine may be used for other purposes; ask your health care provider or pharmacist if you have questions. COMMON BRAND NAME(S): BEIZRAY, Docefrez, Docivyx, Taxotere What should I tell my care team before I take this medication? They need to know if you have any of these conditions: Kidney disease Liver disease Low white blood cell levels Tingling of the fingers or toes or other  nerve disorder An unusual or allergic reaction to docetaxel, polysorbate 80, other medications, foods, dyes, or preservatives Pregnant or trying to get pregnant Breast-feeding How should I use this medication? This medication is injected into a vein. It is given by your care team in a hospital or clinic setting. Talk to your care team about the use of this  medication in children. Special care may be needed. Overdosage: If you think you have taken too much of this medicine contact a poison control center or emergency room at once. NOTE: This medicine is only for you. Do not share this medicine with others. What if I miss a dose? Keep appointments for follow-up doses. It is important not to miss your dose. Call your care team if you are unable to keep an appointment. What may interact with this medication? Do not take this medication with any of the following: Live virus vaccines This medication may also interact with the following: Certain antibiotics, such as clarithromycin, telithromycin Certain antivirals for HIV or hepatitis Certain medications for fungal infections, such as itraconazole, ketoconazole, voriconazole Grapefruit juice Nefazodone Supplements, such as St. John's wort This list may not describe all possible interactions. Give your health care provider a list of all the medicines, herbs, non-prescription drugs, or dietary supplements you use. Also tell them if you smoke, drink alcohol, or use illegal drugs. Some items may interact with your medicine. What should I watch for while using this medication? This medication may make you feel generally unwell. This is not uncommon as chemotherapy can affect healthy cells as well as cancer cells. Report any side effects. Continue your course of treatment even though you feel ill unless your care team tells you to stop. You may need blood work done while you are taking this medication. This medication can cause serious side effects and infusion reactions. To reduce the risk, your care team may give you other medications to take before receiving this one. Be sure to follow the directions from your care team. This medication may increase your risk of getting an infection. Call your care team for advice if you get a fever, chills, sore throat, or other symptoms of a cold or flu. Do not treat  yourself. Try to avoid being around people who are sick. Avoid taking medications that contain aspirin, acetaminophen, ibuprofen, naproxen, or ketoprofen unless instructed by your care team. These medications may hide a fever. Be careful brushing or flossing your teeth or using a toothpick because you may get an infection or bleed more easily. If you have any dental work done, tell your dentist you are receiving this medication. Some products may contain alcohol. Ask your care team if this medication contains alcohol. Be sure to tell all care teams you are taking this medicine. Certain medications, like metronidazole and disulfiram, can cause an unpleasant reaction when taken with alcohol. The reaction includes flushing, headache, nausea, vomiting, sweating, and increased thirst. The reaction can last from 30 minutes to several hours. This medication may affect your coordination, reaction time, or judgement. Do not drive or operate machinery until you know how this medication affects you. Sit up or stand slowly to reduce the risk of dizzy or fainting spells. Drinking alcohol with this medication can increase the risk of these side effects. Talk to your care team about your risk of cancer. You may be more at risk for certain types of cancer if you take this medication. Talk to your care team if you wish to become pregnant or  think you might be pregnant. This medication can cause serious birth defects if taken during pregnancy or if you get pregnant within 2 months after stopping therapy. A negative pregnancy test is required before starting this medication. A reliable form of contraception is recommended while taking this medication and for 2 months after stopping it. Talk to your care team about reliable forms of contraception. Do not breast-feed while taking this medication and for 1 week after stopping therapy. Use a condom during sex and for 4 months after stopping therapy. Tell your care team right away  if you think your partner might be pregnant. This medication can cause serious birth defects. This medication may cause infertility. Talk to your care team if you are concerned about your fertility. What side effects may I notice from receiving this medication? Side effects that you should report to your care team as soon as possible: Allergic reactions--skin rash, itching, hives, swelling of the face, lips, tongue, or throat Change in vision such as blurry vision, seeing halos around lights, vision loss Infection--fever, chills, cough, or sore throat Infusion reactions--chest pain, shortness of breath or trouble breathing, feeling faint or lightheaded Low red blood cell level--unusual weakness or fatigue, dizziness, headache, trouble breathing Pain, tingling, or numbness in the hands or feet Painful swelling, warmth, or redness of the skin, blisters or sores at the infusion site Redness, blistering, peeling, or loosening of the skin, including inside the mouth Sudden or severe stomach pain, bloody diarrhea, fever, nausea, vomiting Swelling of the ankles, hands, or feet Tumor lysis syndrome (TLS)--nausea, vomiting, diarrhea, decrease in the amount of urine, dark urine, unusual weakness or fatigue, confusion, muscle pain or cramps, fast or irregular heartbeat, joint pain Unusual bruising or bleeding Side effects that usually do not require medical attention (report to your care team if they continue or are bothersome): Change in nail shape, thickness, or color Change in taste Hair loss Increased tears This list may not describe all possible side effects. Call your doctor for medical advice about side effects. You may report side effects to FDA at 1-800-FDA-1088. Where should I keep my medication? This medication is given in a hospital or clinic. It will not be stored at home. NOTE: This sheet is a summary. It may not cover all possible information. If you have questions about this medicine,  talk to your doctor, pharmacist, or health care provider.  2024 Elsevier/Gold Standard (2021-08-06 00:00:00)

## 2024-01-10 ENCOUNTER — Other Ambulatory Visit (HOSPITAL_COMMUNITY): Payer: Self-pay

## 2024-01-10 ENCOUNTER — Telehealth: Payer: Self-pay

## 2024-01-10 ENCOUNTER — Other Ambulatory Visit: Payer: Self-pay

## 2024-01-10 DIAGNOSIS — C61 Malignant neoplasm of prostate: Secondary | ICD-10-CM

## 2024-01-10 MED ORDER — DAROLUTAMIDE 300 MG PO TABS: 600.0000 mg | ORAL_TABLET | Freq: Two times a day (BID) | ORAL | 11 refills | Status: DC

## 2024-01-10 NOTE — Telephone Encounter (Signed)
 Oral Oncology Patient Advocate Encounter  Prior Authorization for Nubeqa  300mg  has been approved.    PA# [id number] Effective dates: 01-10-24 through 01-09-25.  Must fill at [facility].   Lucie Lamer, CPhT [place] [address]  [facility] Pharmacy Services Pharmacy Technician Patient Advocate Specialist II [name] Phone: [phone]  Fax: [phone] [email or web address]

## 2024-01-10 NOTE — Telephone Encounter (Signed)
 Oral Oncology Patient Advocate Encounter   Received notification that prior authorization for Nubeqa  300mg  is required.   PA submitted on 01/10/24. Key [id number] Status is pending     Lucie Lamer, CPhT [facility]  [facility] Specialty Pharmacy Services Pharmacy Technician Patient Advocate Specialist II [name] Phone: [phone]  Fax: [phone] [email or web address]

## 2024-01-10 NOTE — Telephone Encounter (Signed)
 Oral Oncology Pharmacist Encounter  Received new prescription for Nubeqa  (darolutamide ) for the treatment of metastatic hormone sensitive prostate cancer with high volume disease in conjunction with docetaxel  and ADT, planned duration until disease progression or unacceptable toxicity.  Labs from 01/09/2024 (CBC and CMP) assessed, no interventions needed. Prescription dose and frequency assessed for appropriateness.   Current medication list in Epic reviewed, no significant/ relevant DDIs with Nubeqa  identified.   Evaluated chart and no patient barriers to medication adherence noted.   Prescription has been e-scribed to the [facility] for benefits analysis and approval.  Oral Oncology Clinic will continue to follow for insurance authorization, copayment issues, initial counseling and start date.  Rei Medlen, PharmD Hematology/Oncology Clinical Pharmacist [facility] Oral Chemotherapy Navigation Clinic [phone] 01/10/2024 8:26 AM

## 2024-01-10 NOTE — Telephone Encounter (Signed)
 Dr. Tina first time docetaxel  f/u call - pt tolerated well Received: Nilsa Metro Katrinka DELENA, RN  P Onc Triage Nurse [facility] Caller: Unspecified (Yesterday, 12:45 PM)

## 2024-01-10 NOTE — Progress Notes (Signed)
 Darolutamide  600 mg twice daily ordered.

## 2024-01-10 NOTE — Telephone Encounter (Signed)
 Mr. Harvie states that he is doing fine. He is eating, drinking, and urinating well. He knows to call the office at [phone] if he has any questions or concerns.

## 2024-01-10 NOTE — Telephone Encounter (Signed)
 Oral Oncology Patient Advocate Encounter  RX info has been sent to [facility].  Lucie Lamer, CPhT [place]  [facility] Specialty Pharmacy Services Pharmacy Technician Patient Advocate Specialist II [name] Phone: [phone]  Fax: [phone] [name]

## 2024-01-11 ENCOUNTER — Inpatient Hospital Stay

## 2024-01-11 ENCOUNTER — Other Ambulatory Visit: Payer: Self-pay | Admitting: *Deleted

## 2024-01-11 ENCOUNTER — Telehealth: Payer: Self-pay | Admitting: *Deleted

## 2024-01-11 VITALS — BP 118/60 | HR 58 | Temp 97.8°F | Resp 17

## 2024-01-11 DIAGNOSIS — C61 Malignant neoplasm of prostate: Secondary | ICD-10-CM

## 2024-01-11 DIAGNOSIS — Z5111 Encounter for antineoplastic chemotherapy: Secondary | ICD-10-CM | POA: Diagnosis not present

## 2024-01-11 MED ORDER — PEGFILGRASTIM-CBQV 6 MG/0.6ML ~~LOC~~ SOSY
6.0000 mg | PREFILLED_SYRINGE | Freq: Once | SUBCUTANEOUS | Status: AC
Start: 2024-01-11 — End: 2024-01-11
  Administered 2024-01-11: 6 mg via SUBCUTANEOUS
  Filled 2024-01-11: qty 0.6

## 2024-01-11 NOTE — Progress Notes (Signed)
 [facility] Clinical Social Work  Initial Assessment   Blake Maxwell is a 53 y.o. year old male contacted by phone. Clinical Social Work was referred by nurse for distress screen needs and assessment of psychosocial needs.   SDOH (Social Determinants of Health) assessments performed: Yes SDOH Interventions    Flowsheet Row Office Visit from 12/29/2023 in [facility] A Dept Of [place]  SDOH Interventions   Food Insecurity Interventions Intervention Not Indicated  Housing Interventions Intervention Not Indicated  Transportation Interventions Intervention Not Indicated  Utilities Interventions Intervention Not Indicated    SDOH Screenings   Food Insecurity: No Food Insecurity (01/11/2024)  Housing: Low Risk  (01/11/2024)  Transportation Needs: No Transportation Needs (01/11/2024)  Utilities: Not At Risk (01/11/2024)  Depression (PHQ2-9): Low Risk  (01/11/2024)  Tobacco Use: Low Risk  (01/03/2024)     Distress Screen completed: No    01/06/2024    6:44 PM  ONCBCN DISTRESS SCREENING  Screening Type Initial Screening  How much distress have you been experiencing in the past week? (0-10) 8  Emotional concerns type Sadness or depression  Spiritual/Religous concerns type Death, dying, or afterlife  Physical Concerns Type  Loss or change of physical abilities      Family/Social Information:  Housing Arrangement: patient lives with his spouse and two children (7 and 9).  Family members/support persons in your life? Family. Patient named his spouse as his primary source of support. Transportation concerns: Patient denied any transportation concerns related to treatment. Employment: Working full time. Patient is still employed FT as a Naval architect. Patient returns back to work on this date following being out for appointments. Patient stated he has to be mindful of medications he uses so they do not interfere with his employment. Patient does have STD benefits  through his employer if needed. Income source: Employment Financial concerns: No financial concerns noted at this time. Type of concern: None Food access concerns: no Religious or spiritual practice: Yes-patient is [place]. Patient described how his faith serves as a Associate Professor for navigating the diagnosis and treatment. Patient engages in prayer and attends church regularly\.  Advanced directives: No Services Currently in place:  Insurance, Income, Employment,   Coping/ Adjustment to diagnosis: Patient understands treatment plan and what happens next? yes, patient understands that he will be having chemotherapy..  Concerns about diagnosis and/or treatment: Patient denied any stressors related to the diagnosis and treatment. When discussing his initial distress levels, he attributed to the newness of treatment and being uncertain of how it would go. Presently, patient stated he is good.  Patient reported stressors: No stressors identified. Hopes and/or priorities: For treatment to be successful / keep working Current coping skills/ strengths: Ability for insight , Active sense of humor , Average or above average intelligence , Capable of independent living , Manufacturing systems engineer , Contractor , General fund of knowledge , Motivation for treatment/growth , Physical Health , Religious Affiliation , and Supportive family/friends     SUMMARY: Current SDOH Barriers:  No SDOH barriers identified.   Clinical Social Work Clinical Goal(s):  No clinical social work goals at this time  Interventions: Discussed common feeling and emotions when being diagnosed with cancer, and the importance of support during treatment Informed patient of the support team roles and support services at [facility] CSW discussed cancer diagnosis to children, presently patient denied any need for resources.  Provided CSW contact information and encouraged patient to call with  any questions or concerns   Follow Up  Plan: Patient will contact CSW with any support or resource needs Patient verbalizes understanding of plan: Yes  Lizbeth Sprague, LCSW Clinical Social Worker [facility]

## 2024-01-11 NOTE — Telephone Encounter (Signed)
 7/25/2025BETHA Oneil GAILS Wickard delivered his [facility] Cancer Claim form.  Advise to bring FMLA if needed. . 01/08/2024: Completed form to provider to review, amend, sign and return to this nurse. 01/10/2024: Received [employer] Cancer Claim signed by provider.  This nurse E-mailed form with pathology report to [email or web address].  Form to [facility] H.I.M bin for items to be scanned.  Copy for patient pick-up on next scheduled appointment.   01/11/2024 Connected with Oneil GAILS Bellamy, [phone] (home) to advise of form completion.  Request envelope when you arrive for registration later today.   Denies questions or needs.  Process completed.

## 2024-01-12 ENCOUNTER — Other Ambulatory Visit: Payer: Self-pay

## 2024-01-12 NOTE — Progress Notes (Signed)
 Lupron ordered to start at the end of January 2026.

## 2024-01-12 NOTE — Telephone Encounter (Signed)
 Oral Chemotherapy Pharmacist Encounter   Patient Education I spoke with patient for overview of new oral chemotherapy medication: Nubeqa  (darolutamide ) for the treatment of metastatic hormone sensitive prostate cancer with high volume disease in conjunction with docetaxel  and ADT, planned duration for Nubeqa  until disease progression or unacceptable drug toxicity.   Pt is doing well. Counseled patient on administration, dosing, side effects, monitoring, drug-food interactions, safe handling, storage, and disposal.   Patient will take Nubeqa  300 mg tablet, 2 tablets (600 mg total) by mouth 2 (two) times daily with a meal.   Patient knows to avoid grapefruit and grapefruit juice while on Nubeqa .   Start date: 01/13/2024 with night dose (with dinner/ supper)   Side effects include but not limited to: changes in LFTs, HTN, fatigue.     Reviewed with patient importance of keeping a medication schedule and plan for any missed doses.   After discussion with patient no patient barriers to medication adherence identified. Distress thermometer not completed during telephone call as patient is already on docetaxel  therapy.    Patient voiced understanding and appreciation. All questions answered. Medication handout provided.   Provided patient with Oral Chemotherapy Navigation Clinic phone number. Patient knows to call the office with questions or concerns.  Lashell Moffitt, PharmD Hematology/Oncology Clinical Pharmacist [facility] [phone] 01/12/2024 11:22 AM

## 2024-01-12 NOTE — Progress Notes (Signed)
 Patient is s/p D1C1 Taxotere .   RN spoke with patient, patient reports fatigue but overall doing well.  He received Eligard 45mg  at [facility] on 7/30.  We will consolidate his care, and patient will receive ADT with Dr. Tina.  Patient will also start Nubeqa  on 8/1.  RN encouraged patient to call with any questions or concerns that may arise.

## 2024-01-13 LAB — METHYLMALONIC ACID, SERUM: Methylmalonic Acid, Quantitative: 88 nmol/L (ref 0–378)

## 2024-01-19 ENCOUNTER — Encounter: Payer: Self-pay | Admitting: Genetic Counselor

## 2024-01-19 ENCOUNTER — Telehealth: Payer: Self-pay | Admitting: Genetic Counselor

## 2024-01-19 DIAGNOSIS — Z1379 Encounter for other screening for genetic and chromosomal anomalies: Secondary | ICD-10-CM | POA: Insufficient documentation

## 2024-01-19 DIAGNOSIS — Z1503 Genetic susceptibility to malignant neoplasm of prostate: Secondary | ICD-10-CM | POA: Insufficient documentation

## 2024-01-19 NOTE — Telephone Encounter (Signed)
 I contacted [name] to discuss his genetic testing results. 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 Detailed clinic note to follow.  The test report has been scanned into EPIC and is located under the Molecular Pathology section of the Results Review tab.  A portion of the result report is included below for reference.   Blake Minetti, MS, [facility] Genetic Counselor [email or web address] (P) [phone]

## 2024-01-24 ENCOUNTER — Ambulatory Visit: Payer: Self-pay | Admitting: Genetic Counselor

## 2024-01-24 DIAGNOSIS — Z1503 Genetic susceptibility to malignant neoplasm of prostate: Secondary | ICD-10-CM

## 2024-01-24 NOTE — Progress Notes (Signed)
 HPI:   Blake Maxwell was previously seen in the [facility] due to a personal and family history of cancer and concerns regarding a hereditary predisposition to cancer. Please refer to our prior cancer genetics clinic note for more information regarding our discussion, assessment and recommendations, at the time. Blake Maxwell recent genetic test results were disclosed to him, as were recommendations warranted by these results. These results and recommendations are discussed in more detail below.  CANCER HISTORY:  Oncology History  Malignant neoplasm of prostate (HCC)  05/09/2018 Initial Diagnosis   Malignant neoplasm of prostate (HCC)  Biopsy on 03/28/18 Highest GS 4+5=9 GG5 in 1 cores. 4+3 in 7 cores. Rest 3+4. All cores + prostate cancer.    12/25/2018 Cancer Staging   Enrolled into PROTEUS trial with 6 months of neoadjuvant ADT +/- apalutamide.  Pretreatment PSA 56.3. 12/25/18 RP w/BPLND. ypT3bN1Mx GS 4+5=9 w/focal +margin 1/13 LN positive  Completed 6 months of adjuvant ADT +/- apa. Staging form: Prostate, AJCC 8th Edition - Pathologic stage from 12/25/2018: Stage IVA (pT3b, pN1, cM0, PSA: 56, Grade Group: 5) - Signed by Sherwood Rise, PA-C on 04/01/2020   04/2020 - 06/2020 Radiation Therapy   Adjuvant radiation (Dr. Patrcia)   03/2021 Progression   BCR   07/2021 PET scan   PSMA PET ADDENDUM: In retrospect and comparison to PSMA PET scan of 01/31/2022, there is a radiotracer avid lesion in the L2 transverse process (RIGHT) with SUV max equal 11.7.   07/2021 PET scan   PSMA PET 1. New radiotracer avid skeletal metastasis in the T7 Vertebral body 2. Increased radiotracer activity at L2 transverse process skeletal metastasis. 3. No metastatic adenopathy in the pelvis periaortic retroperitoneum. 4. No visceral metastasis   07/20/2021 Tumor Marker   Psa 0.81   01/18/2022 Tumor Marker   PSA 1.83   03/08/2022 - 03/18/2022 Radiation Therapy   RT to  oligometastatic lesions on T7 and L2   04/12/2022 Tumor Marker   PSA 1.26   07/06/2022 Tumor Marker   PSA 0.28   01/18/2023 Tumor Marker   PSA 12   03/21/2023 Tumor Marker   PSA 19.5   05/2023 Progression   PET noted progression of bone metastases.  1. Progression of skeletal radiotracer avid prostate cancer metastatic disease. New lesions in the thoracic spine, ribs and pelvis. Approximately 8-10 radiotracer avid lesions. 2. No evidence of metastatic adenopathy or visceral metastasis   06/2023 -  Chemotherapy   Started ADT with apa Developed neutropenia.   06/28/2023 Tumor Marker   PSA 57.2   09/27/2023 Tumor Marker   PSA 10.1 T 24.7   01/09/2024 -  Chemotherapy   Patient is on Treatment Plan : PROSTATE Docetaxel  (73) + Prednisone  q21d       FAMILY HISTORY:  We obtained a detailed, 4-generation family history.  Significant diagnoses are listed below:      Family History  Problem Relation Age of Onset   Leukemia Maternal Aunt          dx. >50   Bladder Cancer Maternal Uncle          dx. >50   Cancer Paternal Uncle          unknown type   Breast cancer Maternal Grandmother          dx. >50   Cancer Paternal Cousin          unknown type   Prostate cancer Neg Hx    [] Expand by [employer]  Blake Maxwell is unaware of previous family history of genetic testing for hereditary cancer risks. There is no reported Ashkenazi Jewish ancestry.   GENETIC TEST RESULTS:  Blake Maxwell tested positive for a single likely pathogenic variant (harmful genetic change) in the HOXB13 gene. Specifically, this variant is c.853delT.   The test report has been scanned into EPIC and is located under the Molecular Pathology section of the Results Review tab.  A portion of the result report is included below for reference. Genetic testing reported out on 01/13/2024.        HOXB13 c.853delT Cancer Risks: Males with the c.853delT variant in HOXB13 are thought to have a 2.4 fold  increased risk for prostate cancer which is about a 31% lifetime risk of prostate cancer (compared to 13% in the general population). Currently, there is no known increase in cancer risk for females with HOXB13 mutations.   Prostate Cancer Screening Recommendations: Men should consider beginning annual PSA blood test and digital rectal exams at age 58.  This information is based on current understanding of the gene and may change in the future.  Implications for Family Members: Hereditary predisposition to cancer due to pathogenic variants in the HOXB13 gene has autosomal dominant inheritance. This means that an individual with a pathogenic variant has a 50% chance of passing the condition on to his/her offspring. Identification of a pathogenic variant allows for the recognition of at-risk relatives who can pursue testing for the familial variant.   Family members are encouraged to consider genetic testing for this familial pathogenic variant. As there are generally no childhood cancer risks associated with pathogenic variants in the HOXB13 gene, individuals in the family are not recommended to have testing until they reach at least 55 years of age. They may contact our office at [phone] for more information or to schedule an appointment. Complimentary testing for the familial variant is available for 90 days from the report date. Family members who live outside of the area are encouraged to find a genetic counselor in their area by visiting: [email or web address].  Resources: FORCE (Facing Our Risk of Cancer Empowered) is a resource for those with a hereditary predisposition to develop cancer.  FORCE provides information about risk reduction, advocacy, legislation, and clinical trials.  Additionally, FORCE provides a platform for collaboration and support; which includes: peer navigation, message boards, local support groups, a toll-free helpline, research registry and  recruitment, advocate training, published medical research, webinars, brochures, mastectomy photos, and more.  For more information, visit www.facingourrisk.org  Our contact number was provided. Blake Maxwell questions were answered to his satisfaction, and he knows he is welcome to call us  at anytime with additional questions or concerns.   Alexzandra Bilton, MS, [facility] Genetic Counselor [email or web address] (P) [phone]

## 2024-01-29 NOTE — Progress Notes (Unsigned)
 [facility] OFFICE PROGRESS NOTE  Patient Care Team: [name], NP as PCP - General (Nurse Practitioner) Vertell Pont, RN as Oncology Nurse Navigator  Blake Maxwell is a 53 y.o.male with history of prostate cancer, CKD being seen at Medical Oncology Clinic for prostate cancer.   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 Germline testing: will obtain Somatic testing: will obtain Current Treatment: ADT/ARPI with docetaxel .  He was initially diagnosis of regional disease at age 33 enrolled into PROTEUS trial with 6 months of neoadjuvant ADT +/- apalutamide with GS 4+5 in one core, pretreatment PSA 56.3 followed by RP w/BPLND. Final pathology ypT3bN1Mx GS 4+5=9 w/focal +margin 1/13 LN positive completed 6 months of adjuvant ADT +/- apalutamide and adjuvant radiation in 06/2020. BCR in 03/2021 followed by oligometastases found on T7 and L2 s/p RT in 02/2022. PSA nadir was 0.28 in 06/2022 and continues to rise. PSA up to 19.5 in Oct 2024 and PET showed 8-10 bone metastases in 05/2023. He was started on ADT with apalutamide and reported neutropenia. After evaluated here we switched to darolutamide  with docetaxel /ADT triplet therapy given high volume disease.   Overall tolerating cycle 1 well.  Will continue treatment.  Discussed genetic results.  He has no son but he has left his brother know. 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  Affecting male with increase risk for prostate cancer. Male may start screening at age 41. At risk for side effect of medication calcium (1000-1200 mg daily from food and supplements) and vitamin D3 (1000 IU daily) Routine dental care Healthy lifestyle to  prevent diabetes and CV disease Weight-bearing exercises (30 minutes per day) Limit alcohol consumption and avoid smoking May use ginger ale, ginger tea for nausea prevention.  Orders Placed This Encounter  Procedures   Testosterone     Standing Status:   Future    Expected Date:   02/20/2024    Expiration Date:   02/19/2025   PSA    Standing Status:   Future    Expected Date:   02/20/2024    Expiration Date:   02/19/2025   Ambulatory referral to Social Work    Referral Priority:   Routine    Referral Type:   Consultation    Referral Reason:   Specialty Services Required    Number of Visits Requested:   1     [name], MD  INTERVAL HISTORY: Patient returns for follow-up. Report some brain fog, fatigue he can tolerate. One nausea, only one vomiting, ginger tea and chewable ginger helped. No fever, chills, mouth sore, diarrhea, constipation, numbness or tingling, trouble urinating.  He did not drink enough while at work.  Oncology History  Malignant neoplasm of prostate (HCC)  05/09/2018 Initial Diagnosis   Malignant neoplasm of prostate (HCC)  Biopsy on 03/28/18 Highest GS 4+5=9 GG5 in 1 cores. 4+3 in 7 cores. Rest 3+4. All cores + prostate cancer.    12/25/2018 Cancer Staging   Enrolled into PROTEUS trial with 6 months of neoadjuvant ADT +/- apalutamide.  Pretreatment PSA 56.3. 12/25/18 RP w/BPLND. ypT3bN1Mx GS 4+5=9 w/focal +margin 1/13 LN positive  Completed 6 months of adjuvant ADT +/- apa. Staging form: Prostate, AJCC 8th Edition - Pathologic stage from  12/25/2018: Stage IVA (pT3b, pN1, cM0, PSA: 56, Grade Group: 5) - Signed by Sherwood Rise, PA-C on 04/01/2020   04/2020 - 06/2020 Radiation Therapy   Adjuvant radiation (Dr. Patrcia)   03/2021 Progression   BCR   07/2021 PET scan   PSMA PET ADDENDUM: In retrospect and comparison to PSMA PET scan of 01/31/2022, there is a radiotracer avid lesion in the L2 transverse process (RIGHT) with SUV max equal 11.7.    07/2021 PET scan   PSMA PET 1. New radiotracer avid skeletal metastasis in the T7 Vertebral body 2. Increased radiotracer activity at L2 transverse process skeletal metastasis. 3. No metastatic adenopathy in the pelvis periaortic retroperitoneum. 4. No visceral metastasis   07/20/2021 Tumor Marker   Psa 0.81   01/18/2022 Tumor Marker   PSA 1.83   03/08/2022 - 03/18/2022 Radiation Therapy   RT to oligometastatic lesions on T7 and L2   04/12/2022 Tumor Marker   PSA 1.26   07/06/2022 Tumor Marker   PSA 0.28   01/18/2023 Tumor Marker   PSA 12   03/21/2023 Tumor Marker   PSA 19.5   05/2023 Progression   PET noted progression of bone metastases.  1. Progression of skeletal radiotracer avid prostate cancer metastatic disease. New lesions in the thoracic spine, ribs and pelvis. Approximately 8-10 radiotracer avid lesions. 2. No evidence of metastatic adenopathy or visceral metastasis   06/2023 -  Chemotherapy   Started ADT with apa Developed neutropenia.   06/28/2023 Tumor Marker   PSA 57.2   09/27/2023 Tumor Marker   PSA 10.1 T 24.7   01/09/2024 -  Chemotherapy   Patient is on Treatment Plan : PROSTATE Docetaxel  (73) + Prednisone  q21d        PHYSICAL EXAMINATION: ECOG PERFORMANCE STATUS: 0 - Asymptomatic  Vitals:   01/30/24 1222  BP: 111/68  Pulse: (!) 58  Resp: 16  Temp: 97.9 F (36.6 C)  SpO2: 100%   Filed Weights   01/30/24 1222  Weight: 206 lb 14.4 oz (93.8 kg)    GENERAL: alert, no distress and comfortable SKIN: skin color normal  LUNGS: clear to auscultation and percussion with normal breathing effort HEART: regular rate & rhythm  ABDOMEN: abdomen soft, non-tender and nondistended. Musculoskeletal: no edema   Relevant data reviewed during this visit included labs.

## 2024-01-29 NOTE — Assessment & Plan Note (Addendum)
 C2 of docetaxel  Continue ADT, and darolutamide  Last ADT was from [facility] with 6-mo dosage on 7/30  Return for follow-up before cycle 2 of docetaxel 

## 2024-01-29 NOTE — Assessment & Plan Note (Addendum)
 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  Affecting male with increase risk for prostate cancer. Male may start screening at age 55.

## 2024-01-30 ENCOUNTER — Inpatient Hospital Stay

## 2024-01-30 ENCOUNTER — Inpatient Hospital Stay (HOSPITAL_BASED_OUTPATIENT_CLINIC_OR_DEPARTMENT_OTHER)

## 2024-01-30 VITALS — BP 111/68 | HR 58 | Temp 97.9°F | Resp 16 | Ht 69.0 in | Wt 206.9 lb

## 2024-01-30 DIAGNOSIS — C7951 Secondary malignant neoplasm of bone: Secondary | ICD-10-CM | POA: Insufficient documentation

## 2024-01-30 DIAGNOSIS — C61 Malignant neoplasm of prostate: Secondary | ICD-10-CM | POA: Diagnosis not present

## 2024-01-30 DIAGNOSIS — Z5111 Encounter for antineoplastic chemotherapy: Secondary | ICD-10-CM | POA: Insufficient documentation

## 2024-01-30 DIAGNOSIS — Z9189 Other specified personal risk factors, not elsewhere classified: Secondary | ICD-10-CM | POA: Diagnosis not present

## 2024-01-30 DIAGNOSIS — Z1379 Encounter for other screening for genetic and chromosomal anomalies: Secondary | ICD-10-CM | POA: Diagnosis not present

## 2024-01-30 DIAGNOSIS — Z5189 Encounter for other specified aftercare: Secondary | ICD-10-CM | POA: Insufficient documentation

## 2024-01-30 LAB — CBC WITH DIFFERENTIAL (CANCER CENTER ONLY)
Abs Immature Granulocytes: 0.03 K/uL (ref 0.00–0.07)
Basophils Absolute: 0 K/uL (ref 0.0–0.1)
Basophils Relative: 1 %
Eosinophils Absolute: 0.2 K/uL (ref 0.0–0.5)
Eosinophils Relative: 4 %
HCT: 33 % — ABNORMAL LOW (ref 39.0–52.0)
Hemoglobin: 11.7 g/dL — ABNORMAL LOW (ref 13.0–17.0)
Immature Granulocytes: 1 %
Lymphocytes Relative: 14 %
Lymphs Abs: 0.8 K/uL (ref 0.7–4.0)
MCH: 32.1 pg (ref 26.0–34.0)
MCHC: 35.5 g/dL (ref 30.0–36.0)
MCV: 90.7 fL (ref 80.0–100.0)
Monocytes Absolute: 0.7 K/uL (ref 0.1–1.0)
Monocytes Relative: 12 %
Neutro Abs: 4.2 K/uL (ref 1.7–7.7)
Neutrophils Relative %: 68 %
Platelet Count: 275 K/uL (ref 150–400)
RBC: 3.64 MIL/uL — ABNORMAL LOW (ref 4.22–5.81)
RDW: 13 % (ref 11.5–15.5)
WBC Count: 6 K/uL (ref 4.0–10.5)
nRBC: 0 % (ref 0.0–0.2)

## 2024-01-30 LAB — CMP (CANCER CENTER ONLY)
ALT: 13 U/L (ref 0–44)
AST: 13 U/L — ABNORMAL LOW (ref 15–41)
Albumin: 4.1 g/dL (ref 3.5–5.0)
Alkaline Phosphatase: 53 U/L (ref 38–126)
Anion gap: 7 (ref 5–15)
BUN: 19 mg/dL (ref 6–20)
CO2: 24 mmol/L (ref 22–32)
Calcium: 9 mg/dL (ref 8.9–10.3)
Chloride: 108 mmol/L (ref 98–111)
Creatinine: 0.83 mg/dL (ref 0.61–1.24)
GFR, Estimated: >60 mL/min (ref >60)
Glucose, Bld: 83 mg/dL (ref 70–99)
Potassium: 3.8 mmol/L (ref 3.5–5.1)
Sodium: 139 mmol/L (ref 135–145)
Total Bilirubin: 0.4 mg/dL (ref 0.0–1.2)
Total Protein: 6.5 g/dL (ref 6.5–8.1)

## 2024-01-30 MED ORDER — SODIUM CHLORIDE 0.9 % IV SOLN
75.0000 mg/m2 | Freq: Once | INTRAVENOUS | Status: AC
  Administered 2024-01-30: 160 mg via INTRAVENOUS
  Filled 2024-01-30: qty 16

## 2024-01-30 MED ORDER — SODIUM CHLORIDE 0.9 % IV SOLN: INTRAVENOUS | Status: DC

## 2024-01-30 MED ORDER — DEXAMETHASONE SODIUM PHOSPHATE 10 MG/ML IJ SOLN
10.0000 mg | Freq: Once | INTRAMUSCULAR | Status: AC
  Administered 2024-01-30: 10 mg via INTRAVENOUS
  Filled 2024-01-30: qty 1

## 2024-01-30 NOTE — Patient Instructions (Signed)
 [facility] - A DEPT OF [facility]  Discharge Instructions: Thank you for choosing [facility] to provide your oncology and hematology care.   If you have a lab appointment with the Cancer Center, please go directly to the Cancer Center and check in at the registration area.   Wear comfortable clothing and clothing appropriate for easy access to any Portacath or PICC line.   We strive to give you quality time with your provider. You may need to reschedule your appointment if you arrive late (15 or more minutes).  Arriving late affects you and other patients whose appointments are after yours.  Also, if you miss three or more appointments without notifying the office, you may be dismissed from the clinic at the provider's discretion.      For prescription refill requests, have your pharmacy contact our office and allow 72 hours for refills to be completed.    Today you received the following chemotherapy and/or immunotherapy agents: docetaxel      To help prevent nausea and vomiting after your treatment, we encourage you to take your nausea medication as directed.  BELOW ARE SYMPTOMS THAT SHOULD BE REPORTED IMMEDIATELY: *FEVER GREATER THAN 100.4 F (38 C) OR HIGHER *CHILLS OR SWEATING *NAUSEA AND VOMITING THAT IS NOT CONTROLLED WITH YOUR NAUSEA MEDICATION *UNUSUAL SHORTNESS OF BREATH *UNUSUAL BRUISING OR BLEEDING *URINARY PROBLEMS (pain or burning when urinating, or frequent urination) *BOWEL PROBLEMS (unusual diarrhea, constipation, pain near the anus) TENDERNESS IN MOUTH AND THROAT WITH OR WITHOUT PRESENCE OF ULCERS (sore throat, sores in mouth, or a toothache) UNUSUAL RASH, SWELLING OR PAIN  UNUSUAL VAGINAL DISCHARGE OR ITCHING   Items with * indicate a potential emergency and should be followed up as soon as possible or go to the Emergency Department if any problems should occur.  Please show the CHEMOTHERAPY ALERT CARD or IMMUNOTHERAPY  ALERT CARD at check-in to the Emergency Department and triage nurse.  Should you have questions after your visit or need to cancel or reschedule your appointment, please contact [facility] - A DEPT OF [facility]  Dept: [phone]  and follow the prompts.  Office hours are 8:00 a.m. to 4:30 p.m. Monday - Friday. Please note that voicemails left after 4:00 p.m. may not be returned until the following business day.  We are closed weekends and major holidays. You have access to a nurse at all times for urgent questions. Please call the main number to the clinic Dept: [phone] and follow the prompts.   For any non-urgent questions, you may also contact your provider using MyChart. We now offer e-Visits for anyone 91 and older to request care online for non-urgent symptoms. For details visit [email or web address].   Also download the MyChart app! Go to the app store, search "MyChart", open the app, select [place], and log in with your MyChart username and password.

## 2024-01-30 NOTE — Assessment & Plan Note (Addendum)
 calcium (1000-1200 mg daily from food and supplements) and vitamin D3 (1000 IU daily) Routine dental care Healthy lifestyle to prevent diabetes and CV disease Weight-bearing exercises (30 minutes per day) Limit alcohol consumption and avoid smoking May use ginger ale, ginger tea for nausea prevention.

## 2024-01-31 ENCOUNTER — Inpatient Hospital Stay

## 2024-01-31 NOTE — Progress Notes (Signed)
 [facility] Clinical Social Work  Clinical Social Work was referred by medical provider for advance directives questions.  Clinical Social Worker contacted patient by phone to offer support and assess for needs.   Interventions: Provided education and assistance to client regarding Advanced Directives.       Follow Up Plan:  No follow up scheduled. Patient / Spouse scheduled for advance directive clinic on 10/13. Patient understands to call [facility] [employer] Support Admin at [phone] if appointments need to be adjusted.    Lizbeth Sprague, LCSW  Clinical Social Worker [facility]

## 2024-02-01 ENCOUNTER — Inpatient Hospital Stay

## 2024-02-01 ENCOUNTER — Other Ambulatory Visit: Payer: Self-pay

## 2024-02-01 VITALS — BP 109/72 | HR 62 | Temp 98.1°F | Resp 16

## 2024-02-01 DIAGNOSIS — Z5111 Encounter for antineoplastic chemotherapy: Secondary | ICD-10-CM | POA: Diagnosis not present

## 2024-02-01 DIAGNOSIS — C61 Malignant neoplasm of prostate: Secondary | ICD-10-CM

## 2024-02-01 MED ORDER — PEGFILGRASTIM-CBQV 6 MG/0.6ML ~~LOC~~ SOSY
6.0000 mg | PREFILLED_SYRINGE | Freq: Once | SUBCUTANEOUS | Status: AC
  Administered 2024-02-01: 6 mg via SUBCUTANEOUS
  Filled 2024-02-01: qty 0.6

## 2024-02-06 ENCOUNTER — Telehealth: Payer: Self-pay | Admitting: *Deleted

## 2024-02-06 ENCOUNTER — Encounter: Payer: Self-pay | Admitting: Genetic Counselor

## 2024-02-06 NOTE — Telephone Encounter (Signed)
 No notes

## 2024-02-06 NOTE — Telephone Encounter (Signed)
-----   Message from Olam HERO sent at 02/06/2024 10:57 AM EDT ----- Regarding: RE: [employer] pathwell pt Just call Ashley(case RN worker below) I have provided most info & she will give you options for single care management  Thanks, Olam HERO ----- Message ----- From: Johnnye Zorita LABOR, RN Sent: 02/06/2024  10:16 AM EDT To: Lisa K Miller; Darlena Clark Subject: RE: [facility] pathwell pt                          What do I need to do for this? ----- Message ----- From: Cleotilde Olam POUR Sent: 02/03/2024   3:22 PM EDT To: Zorita LABOR Johnnye, RN; Alcus Gaskins Subject: [employer] pathwell pt                              Good afternoon,  Update on ptHong Moring Migliaccio 12-14-1970 [id number] auth given for only for 30 days, expiring 02/05/24. Pt has [employer] pathwell plan which requires clinical to reach out to care management.  [phone] [phone] [email or web address]  Docetaxel , Udenyca , Lupron/Eligard  Thanks, Olam HERO

## 2024-02-06 NOTE — Telephone Encounter (Signed)
 LM for Rosina with Sherleen to call me regarding message below

## 2024-02-19 NOTE — Progress Notes (Unsigned)
 [facility] OFFICE PROGRESS NOTE  Patient Care Team: [name], NP as PCP - General (Nurse Practitioner) Vertell Pont, RN as Oncology Nurse Navigator  Blake Maxwell is a 53 y.o.male with history of prostate cancer, CKD being seen at [facility] for prostate cancer.   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 Germline testing: will obtain Somatic testing: will obtain Current Treatment: ADT/ARPI with docetaxel .   Assessment & Plan   No orders of the defined types were placed in this encounter.    Blake JAYSON Chihuahua, MD  INTERVAL HISTORY: Patient returns for follow-up.  He was initially diagnosis of regional disease at age 46 enrolled into PROTEUS trial with 6 months of neoadjuvant ADT +/- apalutamide with GS 4+5 in one core, pretreatment PSA 56.3 followed by RP w/BPLND. Final pathology ypT3bN1Mx GS 4+5=9 w/focal +margin 1/13 LN positive completed 6 months of adjuvant ADT +/- apalutamide and adjuvant radiation in 06/2020. BCR in 03/2021 followed by oligometastases found on T7 and L2 s/p RT in 02/2022. PSA nadir was 0.28 in 06/2022 and continues to rise. PSA up to 19.5 in Oct 2024 and PET showed 8-10 bone metastases in 05/2023. He was started on ADT with apalutamide and reported neutropenia. After evaluated here we switched to darolutamide  with docetaxel /ADT triplet therapy given high volume disease.    Oncology History  Malignant neoplasm of prostate (HCC)  05/09/2018 Initial Diagnosis   Malignant neoplasm of prostate (HCC)  Biopsy on 03/28/18 Highest GS 4+5=9 GG5 in 1 cores. 4+3 in 7 cores. Rest 3+4. All cores + prostate cancer.    12/25/2018 Cancer Staging   Enrolled into PROTEUS trial with 6 months of neoadjuvant ADT +/- apalutamide.  Pretreatment PSA 56.3. 12/25/18 RP w/BPLND. ypT3bN1Mx GS 4+5=9 w/focal +margin 1/13 LN positive  Completed 6 months of adjuvant ADT +/- apa. Staging form: Prostate, AJCC 8th  Edition - Pathologic stage from 12/25/2018: Stage IVA (pT3b, pN1, cM0, PSA: 56, Grade Group: 5) - Signed by Sherwood Rise, PA-C on 04/01/2020   04/2020 - 06/2020 Radiation Therapy   Adjuvant radiation (Dr. Patrcia)   03/2021 Progression   BCR   07/2021 PET scan   PSMA PET ADDENDUM: In retrospect and comparison to PSMA PET scan of 01/31/2022, there is a radiotracer avid lesion in the L2 transverse process (RIGHT) with SUV max equal 11.7.   07/2021 PET scan   PSMA PET 1. New radiotracer avid skeletal metastasis in the T7 Vertebral body 2. Increased radiotracer activity at L2 transverse process skeletal metastasis. 3. No metastatic adenopathy in the pelvis periaortic retroperitoneum. 4. No visceral metastasis   07/20/2021 Tumor Marker   Psa 0.81   01/18/2022 Tumor Marker   PSA 1.83   03/08/2022 - 03/18/2022 Radiation Therapy   RT to oligometastatic lesions on T7 and L2   04/12/2022 Tumor Marker   PSA 1.26   07/06/2022 Tumor Marker   PSA 0.28   01/18/2023 Tumor Marker   PSA 12   03/21/2023 Tumor Marker   PSA 19.5   05/2023 Progression   PET noted progression of bone metastases.  1. Progression of skeletal radiotracer avid prostate cancer metastatic disease. New lesions in the thoracic spine, ribs and pelvis. Approximately 8-10 radiotracer avid lesions. 2. No evidence of metastatic adenopathy or visceral metastasis   06/2023 -  Chemotherapy   Started ADT with apa Developed neutropenia.   06/28/2023 Tumor Marker   PSA 57.2   09/27/2023 Tumor Marker   PSA 10.1  T 24.7   01/09/2024 -  Chemotherapy   Patient is on Treatment Plan : PROSTATE Docetaxel  (75) + Prednisone  q21d        PHYSICAL EXAMINATION: ECOG PERFORMANCE STATUS: {CHL ONC ECOG PS:[id number]}  There were no vitals filed for this visit. There were no vitals filed for this visit.  GENERAL: alert, no distress and comfortable SKIN: skin color normal and no bruising or petechiae or jaundice on exposed  skin EYES: normal, sclera clear OROPHARYNX: no exudate  NECK: No palpable mass LYMPH:  no palpable cervical, axillary lymphadenopathy  LUNGS: clear to auscultation and percussion with normal breathing effort HEART: regular rate & rhythm  ABDOMEN: abdomen soft, non-tender and nondistended. Musculoskeletal: no edema NEURO: no focal motor/sensory deficits  Relevant data reviewed during this visit included ***

## 2024-02-20 ENCOUNTER — Inpatient Hospital Stay

## 2024-02-20 ENCOUNTER — Other Ambulatory Visit: Payer: Self-pay | Admitting: *Deleted

## 2024-02-20 ENCOUNTER — Other Ambulatory Visit: Payer: Self-pay

## 2024-02-20 VITALS — BP 111/67 | HR 60 | Temp 97.7°F | Resp 13 | Wt 206.3 lb

## 2024-02-20 DIAGNOSIS — C61 Malignant neoplasm of prostate: Secondary | ICD-10-CM

## 2024-02-20 DIAGNOSIS — Z5111 Encounter for antineoplastic chemotherapy: Secondary | ICD-10-CM | POA: Insufficient documentation

## 2024-02-20 DIAGNOSIS — C7951 Secondary malignant neoplasm of bone: Secondary | ICD-10-CM | POA: Insufficient documentation

## 2024-02-20 DIAGNOSIS — T451X5A Adverse effect of antineoplastic and immunosuppressive drugs, initial encounter: Secondary | ICD-10-CM | POA: Diagnosis not present

## 2024-02-20 DIAGNOSIS — Z5189 Encounter for other specified aftercare: Secondary | ICD-10-CM | POA: Diagnosis not present

## 2024-02-20 DIAGNOSIS — D6481 Anemia due to antineoplastic chemotherapy: Secondary | ICD-10-CM | POA: Diagnosis not present

## 2024-02-20 DIAGNOSIS — Z9189 Other specified personal risk factors, not elsewhere classified: Secondary | ICD-10-CM

## 2024-02-20 LAB — CBC WITH DIFFERENTIAL (CANCER CENTER ONLY)
Abs Immature Granulocytes: 0.01 K/uL (ref 0.00–0.07)
Basophils Absolute: 0 K/uL (ref 0.0–0.1)
Basophils Relative: 1 %
Eosinophils Absolute: 0 K/uL (ref 0.0–0.5)
Eosinophils Relative: 1 %
HCT: 32.5 % — ABNORMAL LOW (ref 39.0–52.0)
Hemoglobin: 11.3 g/dL — ABNORMAL LOW (ref 13.0–17.0)
Immature Granulocytes: 0 %
Lymphocytes Relative: 19 %
Lymphs Abs: 0.8 K/uL (ref 0.7–4.0)
MCH: 32.3 pg (ref 26.0–34.0)
MCHC: 34.8 g/dL (ref 30.0–36.0)
MCV: 92.9 fL (ref 80.0–100.0)
Monocytes Absolute: 0.6 K/uL (ref 0.1–1.0)
Monocytes Relative: 16 %
Neutro Abs: 2.6 K/uL (ref 1.7–7.7)
Neutrophils Relative %: 63 %
Platelet Count: 224 K/uL (ref 150–400)
RBC: 3.5 MIL/uL — ABNORMAL LOW (ref 4.22–5.81)
RDW: 14.4 % (ref 11.5–15.5)
WBC Count: 4 K/uL (ref 4.0–10.5)
nRBC: 0 % (ref 0.0–0.2)

## 2024-02-20 LAB — CMP (CANCER CENTER ONLY)
ALT: 14 U/L (ref 0–44)
AST: 14 U/L — ABNORMAL LOW (ref 15–41)
Albumin: 4.1 g/dL (ref 3.5–5.0)
Alkaline Phosphatase: 55 U/L (ref 38–126)
Anion gap: 6 (ref 5–15)
BUN: 17 mg/dL (ref 6–20)
CO2: 24 mmol/L (ref 22–32)
Calcium: 9.2 mg/dL (ref 8.9–10.3)
Chloride: 110 mmol/L (ref 98–111)
Creatinine: 0.7 mg/dL (ref 0.61–1.24)
GFR, Estimated: >60 mL/min (ref >60)
Glucose, Bld: 90 mg/dL (ref 70–99)
Potassium: 3.9 mmol/L (ref 3.5–5.1)
Sodium: 140 mmol/L (ref 135–145)
Total Bilirubin: 0.4 mg/dL (ref 0.0–1.2)
Total Protein: 6.5 g/dL (ref 6.5–8.1)

## 2024-02-20 MED ORDER — SODIUM CHLORIDE 0.9 % IV SOLN
75.0000 mg/m2 | Freq: Once | INTRAVENOUS | Status: AC
  Administered 2024-02-20: 160 mg via INTRAVENOUS
  Filled 2024-02-20: qty 16

## 2024-02-20 MED ORDER — SODIUM CHLORIDE 0.9 % IV SOLN: INTRAVENOUS | Status: DC

## 2024-02-20 MED ORDER — DEXAMETHASONE SODIUM PHOSPHATE 10 MG/ML IJ SOLN
10.0000 mg | Freq: Once | INTRAMUSCULAR | Status: AC
  Administered 2024-02-20: 10 mg via INTRAVENOUS
  Filled 2024-02-20: qty 1

## 2024-02-20 NOTE — Assessment & Plan Note (Addendum)
 Stable, will continue to monitor each cycle with labs before treatment.

## 2024-02-20 NOTE — Progress Notes (Signed)
 Patient is established with a treatment plan and is actively engaged in care. Nurse Navigator services not currently indicated at this time. Will re-evaluate if needs change or if additional support is requested.

## 2024-02-20 NOTE — Patient Instructions (Signed)
 [facility] - A DEPT OF [facility]  Discharge Instructions: Thank you for choosing [facility] to provide your oncology and hematology care.   If you have a lab appointment with the Cancer Center, please go directly to the Cancer Center and check in at the registration area.   Wear comfortable clothing and clothing appropriate for easy access to any Portacath or PICC line.   We strive to give you quality time with your provider. You may need to reschedule your appointment if you arrive late (15 or more minutes).  Arriving late affects you and other patients whose appointments are after yours.  Also, if you miss three or more appointments without notifying the office, you may be dismissed from the clinic at the provider's discretion.      For prescription refill requests, have your pharmacy contact our office and allow 72 hours for refills to be completed.    Today you received the following chemotherapy and/or immunotherapy agents: docetaxel      To help prevent nausea and vomiting after your treatment, we encourage you to take your nausea medication as directed.  BELOW ARE SYMPTOMS THAT SHOULD BE REPORTED IMMEDIATELY: *FEVER GREATER THAN 100.4 F (38 C) OR HIGHER *CHILLS OR SWEATING *NAUSEA AND VOMITING THAT IS NOT CONTROLLED WITH YOUR NAUSEA MEDICATION *UNUSUAL SHORTNESS OF BREATH *UNUSUAL BRUISING OR BLEEDING *URINARY PROBLEMS (pain or burning when urinating, or frequent urination) *BOWEL PROBLEMS (unusual diarrhea, constipation, pain near the anus) TENDERNESS IN MOUTH AND THROAT WITH OR WITHOUT PRESENCE OF ULCERS (sore throat, sores in mouth, or a toothache) UNUSUAL RASH, SWELLING OR PAIN  UNUSUAL VAGINAL DISCHARGE OR ITCHING   Items with * indicate a potential emergency and should be followed up as soon as possible or go to the Emergency Department if any problems should occur.  Please show the CHEMOTHERAPY ALERT CARD or IMMUNOTHERAPY  ALERT CARD at check-in to the Emergency Department and triage nurse.  Should you have questions after your visit or need to cancel or reschedule your appointment, please contact [facility] - A DEPT OF [facility]  Dept: [phone]  and follow the prompts.  Office hours are 8:00 a.m. to 4:30 p.m. Monday - Friday. Please note that voicemails left after 4:00 p.m. may not be returned until the following business day.  We are closed weekends and major holidays. You have access to a nurse at all times for urgent questions. Please call the main number to the clinic Dept: [phone] and follow the prompts.   For any non-urgent questions, you may also contact your provider using MyChart. We now offer e-Visits for anyone 91 and older to request care online for non-urgent symptoms. For details visit [email or web address].   Also download the MyChart app! Go to the app store, search "MyChart", open the app, select [place], and log in with your MyChart username and password.

## 2024-02-20 NOTE — Assessment & Plan Note (Addendum)
 C2 of docetaxel  Continue ADT, and darolutamide  Last ADT was from [facility] with 6-mo dosage on 7/30  Return for follow-up before cycle 2 of docetaxel 

## 2024-02-20 NOTE — Assessment & Plan Note (Addendum)
 calcium (1000-1200 mg daily from food and supplements) and vitamin D3 (1000 IU daily) Routine dental care Healthy lifestyle to prevent diabetes and CV disease Weight-bearing exercises (30 minutes per day) Limit alcohol consumption and avoid smoking May use ginger ale, ginger tea for nausea prevention.

## 2024-02-21 LAB — TESTOSTERONE: Testosterone: 4 ng/dL — ABNORMAL LOW (ref 264–916)

## 2024-02-21 LAB — PROSTATE-SPECIFIC AG, SERUM (LABCORP): Prostate Specific Ag, Serum: <0.1 ng/mL (ref 0.0–4.0)

## 2024-02-22 ENCOUNTER — Inpatient Hospital Stay

## 2024-02-22 VITALS — BP 109/64 | HR 73 | Temp 98.5°F | Resp 16

## 2024-02-22 DIAGNOSIS — Z5111 Encounter for antineoplastic chemotherapy: Secondary | ICD-10-CM | POA: Diagnosis not present

## 2024-02-22 DIAGNOSIS — C61 Malignant neoplasm of prostate: Secondary | ICD-10-CM

## 2024-02-22 MED ORDER — PEGFILGRASTIM-CBQV 6 MG/0.6ML ~~LOC~~ SOSY
6.0000 mg | PREFILLED_SYRINGE | Freq: Once | SUBCUTANEOUS | Status: AC
  Administered 2024-02-22: 6 mg via SUBCUTANEOUS
  Filled 2024-02-22: qty 0.6

## 2024-02-27 ENCOUNTER — Telehealth: Payer: Self-pay | Admitting: *Deleted

## 2024-02-27 NOTE — Telephone Encounter (Signed)
 Blake Maxwell states he had 1 episode of dark pink urine today. Wanted to make Dr Tina aware

## 2024-03-07 ENCOUNTER — Telehealth: Payer: Self-pay | Admitting: *Deleted

## 2024-03-07 ENCOUNTER — Other Ambulatory Visit: Payer: Self-pay | Admitting: *Deleted

## 2024-03-07 DIAGNOSIS — C61 Malignant neoplasm of prostate: Secondary | ICD-10-CM

## 2024-03-07 NOTE — Telephone Encounter (Signed)
 West states he has noticed an unusual odor in his urine. States it started after he had an indwelling foley catheter placed. Catheter has now been removed. Denies any difficulty urinating or burning. No fever.   Encouraged to push fluids and call us  if gets worse. Will run UA and culture when he comes for lab, office visit and treatment on Monday

## 2024-03-08 ENCOUNTER — Other Ambulatory Visit: Payer: Self-pay

## 2024-03-08 DIAGNOSIS — C61 Malignant neoplasm of prostate: Secondary | ICD-10-CM

## 2024-03-11 NOTE — Assessment & Plan Note (Addendum)
 Stable, will continue to monitor each cycle with labs before treatment.

## 2024-03-11 NOTE — Assessment & Plan Note (Addendum)
 Postpone cycle 4 of docetaxel  for 2 weeks Continue ADT, and darolutamide  HOXB13 mutations.  Last ADT was from AU with 6-mo dosage on 7/30. Will change to orgovyx in end of January Return for follow-up before cycle 4 of docetaxel 

## 2024-03-11 NOTE — Assessment & Plan Note (Addendum)
 calcium (1000-1200 mg daily from food and supplements) and vitamin D3 (1000 IU daily) Routine dental care Healthy lifestyle to prevent diabetes and CV disease Weight-bearing exercises (30 minutes per day) Limit alcohol consumption and avoid smoking May use ginger ale, ginger tea for nausea prevention.

## 2024-03-11 NOTE — Progress Notes (Unsigned)
 [facility] OFFICE PROGRESS NOTE  Patient Care Team: [name], Blake Maxwell as PCP - General (Nurse Practitioner) Vertell Pont, RN as Oncology Nurse Navigator  Blake Maxwell is a 53 y.o.male with history of prostate cancer, CKD being seen at [facility] for prostate cancer.   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   Neuropathy.  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  Mild anemia.  Will postpone treatment. Assessment & Plan Malignant neoplasm of prostate (HCC) Postpone cycle 4 of docetaxel  for 2 weeks Continue ADT, and darolutamide  HOXB13 mutations.  Last ADT was from AU with 6-mo dosage on 7/30. Will change to orgovyx in end of January Return for follow-up before cycle 4 of docetaxel  Anemia due to antineoplastic chemotherapy Stable, will continue to monitor each cycle with labs before treatment. At risk for side effect of medication calcium (1000-1200 mg daily from food and supplements) and vitamin D3 (1000 IU daily) Routine dental care Healthy lifestyle to prevent diabetes and CV disease Weight-bearing exercises (30 minutes per day) Limit alcohol consumption and avoid smoking May use ginger ale, ginger tea for nausea prevention. Neuropathy Intermittent.  Will postpone treatment for 2 weeks  Orders Placed This Encounter  Procedures   PSA    Standing Status:   Future    Expected Date:   03/26/2024    Expiration Date:   03/26/2025   Testosterone     Standing Status:   Future    Expected Date:   03/26/2024    Expiration Date:   03/26/2025   CBC with Differential (Cancer Center Only)    Standing Status:   Future    Expected Date:   03/26/2024    Expiration Date:   03/26/2025   Urinalysis, Complete w Microscopic    Standing Status:   Future    Expected Date:   03/26/2024    Expiration Date:   03/26/2025    CMP (Cancer Center only)    Standing Status:   Future    Expected Date:   03/26/2024    Expiration Date:   03/26/2025     Blake JAYSON Chihuahua, MD  INTERVAL HISTORY: Patient returns for follow-up. No fever, chills, coughing, nausea, vomiting, diarrhea, constipation, rash.  Report needed a urinary cath briefly for trouble urinating and it has been fine. No burning pain.  Report briefly tingling in the fingertips about 2-3 days resolved.  Oncology History  Malignant neoplasm of prostate (HCC)  05/09/2018 Initial Diagnosis   Malignant neoplasm of prostate (HCC)  Biopsy on 03/28/18 Highest GS 4+5=9 GG5 in 1 cores. 4+3 in 7 cores. Rest 3+4. All cores + prostate cancer.    12/25/2018 Cancer Staging   Enrolled into PROTEUS trial with 6 months of neoadjuvant ADT +/- apalutamide.  Pretreatment PSA 56.3. 12/25/18 RP w/BPLND. ypT3bN1Mx GS 4+5=9 w/focal +margin 1/13 LN positive  Completed 6 months of adjuvant ADT +/- apa. Staging form: Prostate, AJCC 8th Edition - Pathologic stage from 12/25/2018: Stage IVA (pT3b, pN1, cM0, PSA: 56, Grade Group: 5) - Signed by Sherwood Rise, PA-C on 04/01/2020   04/2020 - 06/2020 Radiation Therapy   Adjuvant radiation (Dr. Patrcia)   03/2021 Progression   BCR   07/2021 PET scan   PSMA PET ADDENDUM: In retrospect and comparison to PSMA PET scan of 01/31/2022, there is a radiotracer avid lesion in the L2 transverse process (RIGHT) with SUV max equal 11.7.  07/2021 PET scan   PSMA PET 1. New radiotracer avid skeletal metastasis in the T7 Vertebral body 2. Increased radiotracer activity at L2 transverse process skeletal metastasis. 3. No metastatic adenopathy in the pelvis periaortic retroperitoneum. 4. No visceral metastasis   07/20/2021 Tumor Marker   Psa 0.81   01/18/2022 Tumor Marker   PSA 1.83   03/08/2022 - 03/18/2022 Radiation Therapy   RT to oligometastatic lesions on T7 and L2   04/12/2022 Tumor Marker   PSA 1.26   07/06/2022 Tumor Marker    PSA 0.28   01/18/2023 Tumor Marker   PSA 12   03/21/2023 Tumor Marker   PSA 19.5   05/2023 Progression   PET noted progression of bone metastases.  1. Progression of skeletal radiotracer avid prostate cancer metastatic disease. New lesions in the thoracic spine, ribs and pelvis. Approximately 8-10 radiotracer avid lesions. 2. No evidence of metastatic adenopathy or visceral metastasis   06/2023 -  Chemotherapy   Started ADT with apa Developed neutropenia.   06/28/2023 Tumor Marker   PSA 57.2   09/27/2023 Tumor Marker   PSA 10.1 T 24.7   01/09/2024 -  Chemotherapy   Patient is on Treatment Plan : PROSTATE Docetaxel  (75) + Prednisone  q21d        PHYSICAL EXAMINATION: ECOG PERFORMANCE STATUS: 0 - Asymptomatic  Vitals:   03/12/24 1408  BP: 119/69  Pulse: 62  Resp: 13  Temp: (!) 97.5 F (36.4 C)  SpO2: 100%   Filed Weights   03/12/24 1408  Weight: 204 lb 9.6 oz (92.8 kg)    GENERAL: alert, no distress and comfortable SKIN: skin color normal LUNGS: clear to auscultation and no wheeze or rales with normal breathing effort HEART: regular rate & rhythm  ABDOMEN: abdomen soft, non-tender and nondistended. Musculoskeletal: no edema   Relevant data reviewed during this visit included labs.  New labs ordered.

## 2024-03-12 ENCOUNTER — Inpatient Hospital Stay

## 2024-03-12 VITALS — BP 119/69 | HR 62 | Temp 97.5°F | Resp 13 | Wt 204.6 lb

## 2024-03-12 DIAGNOSIS — Z9189 Other specified personal risk factors, not elsewhere classified: Secondary | ICD-10-CM

## 2024-03-12 DIAGNOSIS — C61 Malignant neoplasm of prostate: Secondary | ICD-10-CM

## 2024-03-12 DIAGNOSIS — G629 Polyneuropathy, unspecified: Secondary | ICD-10-CM

## 2024-03-12 DIAGNOSIS — D6481 Anemia due to antineoplastic chemotherapy: Secondary | ICD-10-CM

## 2024-03-12 DIAGNOSIS — Z5111 Encounter for antineoplastic chemotherapy: Secondary | ICD-10-CM | POA: Diagnosis not present

## 2024-03-12 DIAGNOSIS — T451X5A Adverse effect of antineoplastic and immunosuppressive drugs, initial encounter: Secondary | ICD-10-CM

## 2024-03-12 LAB — CBC WITH DIFFERENTIAL (CANCER CENTER ONLY)
Abs Immature Granulocytes: 0.02 K/uL (ref 0.00–0.07)
Basophils Absolute: 0 K/uL (ref 0.0–0.1)
Basophils Relative: 0 %
Eosinophils Absolute: 0 K/uL (ref 0.0–0.5)
Eosinophils Relative: 0 %
HCT: 31.9 % — ABNORMAL LOW (ref 39.0–52.0)
Hemoglobin: 10.9 g/dL — ABNORMAL LOW (ref 13.0–17.0)
Immature Granulocytes: 0 %
Lymphocytes Relative: 11 %
Lymphs Abs: 0.7 K/uL (ref 0.7–4.0)
MCH: 31.7 pg (ref 26.0–34.0)
MCHC: 34.2 g/dL (ref 30.0–36.0)
MCV: 92.7 fL (ref 80.0–100.0)
Monocytes Absolute: 0.7 K/uL (ref 0.1–1.0)
Monocytes Relative: 12 %
Neutro Abs: 4.9 K/uL (ref 1.7–7.7)
Neutrophils Relative %: 77 %
Platelet Count: 276 K/uL (ref 150–400)
RBC: 3.44 MIL/uL — ABNORMAL LOW (ref 4.22–5.81)
RDW: 14.6 % (ref 11.5–15.5)
WBC Count: 6.4 K/uL (ref 4.0–10.5)
nRBC: 0 % (ref 0.0–0.2)

## 2024-03-12 LAB — CMP (CANCER CENTER ONLY)
ALT: 9 U/L (ref 0–44)
AST: 12 U/L — ABNORMAL LOW (ref 15–41)
Albumin: 3.9 g/dL (ref 3.5–5.0)
Alkaline Phosphatase: 50 U/L (ref 38–126)
Anion gap: 6 (ref 5–15)
BUN: 17 mg/dL (ref 6–20)
CO2: 25 mmol/L (ref 22–32)
Calcium: 9 mg/dL (ref 8.9–10.3)
Chloride: 108 mmol/L (ref 98–111)
Creatinine: 0.88 mg/dL (ref 0.61–1.24)
GFR, Estimated: >60 mL/min (ref >60)
Glucose, Bld: 91 mg/dL (ref 70–99)
Potassium: 4 mmol/L (ref 3.5–5.1)
Sodium: 139 mmol/L (ref 135–145)
Total Bilirubin: 0.3 mg/dL (ref 0.0–1.2)
Total Protein: 6.5 g/dL (ref 6.5–8.1)

## 2024-03-12 LAB — URINALYSIS, COMPLETE (UACMP) WITH MICROSCOPIC
Bilirubin Urine: NEGATIVE
Glucose, UA: NEGATIVE mg/dL
Hgb urine dipstick: NEGATIVE
Ketones, ur: NEGATIVE mg/dL
Nitrite: NEGATIVE
Protein, ur: NEGATIVE mg/dL
Specific Gravity, Urine: 1.013 (ref 1.005–1.030)
pH: 6 (ref 5.0–8.0)

## 2024-03-12 LAB — PSA: Prostatic Specific Antigen: 0.09 ng/mL (ref 0.00–4.00)

## 2024-03-12 MED ORDER — DEXAMETHASONE SODIUM PHOSPHATE 10 MG/ML IJ SOLN: 10.0000 mg | Freq: Once | INTRAMUSCULAR | Status: DC

## 2024-03-12 MED ORDER — SODIUM CHLORIDE 0.9 % IV SOLN: 75.0000 mg/m2 | Freq: Once | INTRAVENOUS | Status: DC

## 2024-03-12 MED ORDER — SODIUM CHLORIDE 0.9 % IV SOLN: INTRAVENOUS | Status: DC

## 2024-03-12 NOTE — Progress Notes (Signed)
 Treatment was deferred.

## 2024-03-13 ENCOUNTER — Telehealth: Payer: Self-pay

## 2024-03-13 DIAGNOSIS — G629 Polyneuropathy, unspecified: Secondary | ICD-10-CM | POA: Insufficient documentation

## 2024-03-13 DIAGNOSIS — C61 Malignant neoplasm of prostate: Secondary | ICD-10-CM

## 2024-03-13 LAB — TESTOSTERONE: Testosterone: <3 ng/dL — ABNORMAL LOW (ref 264–916)

## 2024-03-13 MED ORDER — DEXAMETHASONE 4 MG PO TABS: ORAL_TABLET | ORAL | 1 refills | Status: DC

## 2024-03-13 NOTE — Telephone Encounter (Signed)
 Spoke with the patient he needs refills on his Decadron . Refill sent to [facility]. Arland Legions BSN RN

## 2024-03-13 NOTE — Telephone Encounter (Signed)
 Scheduled patient treatment for 10/13. Called and spoke with the patient regarding the appointments, he is aware.

## 2024-03-13 NOTE — Assessment & Plan Note (Addendum)
 Intermittent.  Will postpone treatment for 2 weeks

## 2024-03-14 ENCOUNTER — Inpatient Hospital Stay

## 2024-03-15 ENCOUNTER — Telehealth: Payer: Self-pay | Admitting: *Deleted

## 2024-03-15 NOTE — Telephone Encounter (Signed)
 Mr Blake Maxwell states he is still having a burning sensation with urination. Next appt is 10/13.

## 2024-03-16 ENCOUNTER — Other Ambulatory Visit: Payer: Self-pay | Admitting: *Deleted

## 2024-03-16 ENCOUNTER — Other Ambulatory Visit: Payer: Self-pay

## 2024-03-16 MED ORDER — SULFAMETHOXAZOLE-TRIMETHOPRIM 800-160 MG PO TABS: 1.0000 | ORAL_TABLET | Freq: Two times a day (BID) | ORAL | 0 refills | Status: DC

## 2024-03-16 NOTE — Telephone Encounter (Signed)
 Notified that Dr Tina wants him to take Bactrim  DS twice a day for 5 days. Prescription sent to Arloa Prior at [facility]

## 2024-03-20 ENCOUNTER — Other Ambulatory Visit: Payer: Self-pay

## 2024-03-25 NOTE — Assessment & Plan Note (Signed)
 Proceed with cycle 4 of docetaxel   If worsening neuropathy will postpone C5 to 11/10 from 11/3 Continue ADT, and darolutamide  HOXB13 mutations.  Last ADT was from AU with 6-mo dosage on 7/30. Will change to orgovyx in end of January Return for follow-up before cycle 4 of docetaxel 

## 2024-03-25 NOTE — Assessment & Plan Note (Signed)
 Stable, will continue to monitor each cycle with labs before treatment.

## 2024-03-25 NOTE — Progress Notes (Unsigned)
 [facility] OFFICE PROGRESS NOTE  Patient Care Team: [name], NP as PCP - General (Nurse Practitioner) Vertell Pont, RN as Oncology Nurse Navigator  Blake Maxwell is a 53 y.o.male with history of prostate cancer, CKD being seen at [facility] for prostate cancer.   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   Neuropathy.  Resolved. Resume treatment.  Return in 3 weeks. Assessment & Plan Malignant neoplasm of prostate (HCC) Proceed with cycle 4 of docetaxel   If worsening neuropathy will postpone C5 to 11/10 from 11/3 Continue ADT, and darolutamide  HOXB13 mutations.  Last ADT was from AU with 6-mo dosage on 7/30. Will change to orgovyx in end of January Return for follow-up before cycle 4 of docetaxel  Anemia due to antineoplastic chemotherapy Stable, will continue to monitor each cycle with labs before treatment. 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 No sons.  Family members are encouraged to consider genetic testing for this familial pathogenic variant in adult age. Neuropathy Resolved.   Will proceed with treatment today. At risk for side effect of medication calcium (1000-1200 mg daily from food and supplements) and vitamin D3 (1000 IU daily) Routine dental care Healthy lifestyle to prevent diabetes and CV disease Weight-bearing exercises (30 minutes per day) Limit alcohol consumption and avoid smoking May use ginger ale, ginger tea for nausea prevention.   Blake JAYSON Chihuahua, MD  INTERVAL HISTORY: Patient returns for follow-up.  Neuropathy resolved. No fever, chills, stomach pain, nausea, vomiting, constipation or diarrhea. No burning urination or dysuria or hematuria. No bloody stool.  Oncology History  Malignant neoplasm of prostate (HCC)  05/09/2018 Initial Diagnosis    Malignant neoplasm of prostate (HCC)  Biopsy on 03/28/18 Highest GS 4+5=9 GG5 in 1 cores. 4+3 in 7 cores. Rest 3+4. All cores + prostate cancer.    12/25/2018 Cancer Staging   Enrolled into PROTEUS trial with 6 months of neoadjuvant ADT +/- apalutamide.  Pretreatment PSA 56.3. 12/25/18 RP w/BPLND. ypT3bN1Mx GS 4+5=9 w/focal +margin 1/13 LN positive  Completed 6 months of adjuvant ADT +/- apa. Staging form: Prostate, AJCC 8th Edition - Pathologic stage from 12/25/2018: Stage IVA (pT3b, pN1, cM0, PSA: 56, Grade Group: 5) - Signed by Sherwood Rise, PA-C on 04/01/2020   04/2020 - 06/2020 Radiation Therapy   Adjuvant radiation (Dr. Patrcia)   03/2021 Progression   BCR   07/2021 PET scan   PSMA PET ADDENDUM: In retrospect and comparison to PSMA PET scan of 01/31/2022, there is a radiotracer avid lesion in the L2 transverse process (RIGHT) with SUV max equal 11.7.   07/2021 PET scan   PSMA PET 1. New radiotracer avid skeletal metastasis in the T7 Vertebral body 2. Increased radiotracer activity at L2 transverse process skeletal metastasis. 3. No metastatic adenopathy in the pelvis periaortic retroperitoneum. 4. No visceral metastasis   07/20/2021 Tumor Marker   Psa 0.81   01/18/2022 Tumor Marker   PSA 1.83   03/08/2022 - 03/18/2022 Radiation Therapy   RT to oligometastatic lesions on T7 and L2   04/12/2022 Tumor Marker   PSA 1.26   07/06/2022 Tumor Marker   PSA 0.28   01/18/2023 Tumor Marker   PSA 12   03/21/2023 Tumor Marker   PSA 19.5   05/2023 Progression   PET noted progression of bone metastases.  1. Progression of skeletal radiotracer avid prostate cancer metastatic disease. New  lesions in the thoracic spine, ribs and pelvis. Approximately 8-10 radiotracer avid lesions. 2. No evidence of metastatic adenopathy or visceral metastasis   06/2023 -  Chemotherapy   Started ADT with apa Developed neutropenia.   06/28/2023 Tumor Marker   PSA 57.2   09/27/2023 Tumor Marker    PSA 10.1 T 24.7   01/09/2024 -  Chemotherapy   Patient is on Treatment Plan : PROSTATE Docetaxel  (75) + Prednisone  q21d        PHYSICAL EXAMINATION: ECOG PERFORMANCE STATUS: 0 - Asymptomatic  Vitals:   03/26/24 1155  BP: 126/70  Pulse: 65  Resp: 17  Temp: 98.1 F (36.7 C)  SpO2: 100%   Filed Weights   03/26/24 1155  Weight: 206 lb (93.4 kg)    GENERAL: alert, no distress and comfortable SKIN: skin color  LUNGS: clear to auscultation and no wheeze or rales with normal breathing effort HEART: regular rate & rhythm  ABDOMEN: abdomen soft, non-tender and nondistended. Musculoskeletal: no edema   Relevant data reviewed during this visit included labs.  New labs ordered.

## 2024-03-25 NOTE — Assessment & Plan Note (Signed)
 He already informed family. No sons.  Family members are encouraged to consider genetic testing for this familial pathogenic variant in adult age.

## 2024-03-26 ENCOUNTER — Other Ambulatory Visit: Payer: Self-pay | Admitting: Nurse Practitioner

## 2024-03-26 ENCOUNTER — Inpatient Hospital Stay

## 2024-03-26 ENCOUNTER — Inpatient Hospital Stay: Admitting: Licensed Clinical Social Worker

## 2024-03-26 VITALS — BP 126/70 | HR 65 | Temp 98.1°F | Resp 17 | Ht 69.0 in | Wt 206.0 lb

## 2024-03-26 DIAGNOSIS — Z5111 Encounter for antineoplastic chemotherapy: Secondary | ICD-10-CM | POA: Insufficient documentation

## 2024-03-26 DIAGNOSIS — T451X5A Adverse effect of antineoplastic and immunosuppressive drugs, initial encounter: Secondary | ICD-10-CM

## 2024-03-26 DIAGNOSIS — C61 Malignant neoplasm of prostate: Secondary | ICD-10-CM

## 2024-03-26 DIAGNOSIS — G629 Polyneuropathy, unspecified: Secondary | ICD-10-CM | POA: Diagnosis not present

## 2024-03-26 DIAGNOSIS — C7951 Secondary malignant neoplasm of bone: Secondary | ICD-10-CM | POA: Diagnosis present

## 2024-03-26 DIAGNOSIS — Z9189 Other specified personal risk factors, not elsewhere classified: Secondary | ICD-10-CM

## 2024-03-26 DIAGNOSIS — R310 Gross hematuria: Secondary | ICD-10-CM

## 2024-03-26 DIAGNOSIS — D6481 Anemia due to antineoplastic chemotherapy: Secondary | ICD-10-CM

## 2024-03-26 DIAGNOSIS — N200 Calculus of kidney: Secondary | ICD-10-CM

## 2024-03-26 DIAGNOSIS — Z1503 Genetic susceptibility to malignant neoplasm of prostate: Secondary | ICD-10-CM

## 2024-03-26 DIAGNOSIS — Z5189 Encounter for other specified aftercare: Secondary | ICD-10-CM | POA: Insufficient documentation

## 2024-03-26 LAB — CMP (CANCER CENTER ONLY)
ALT: 17 U/L (ref 0–44)
AST: 13 U/L — ABNORMAL LOW (ref 15–41)
Albumin: 4.1 g/dL (ref 3.5–5.0)
Alkaline Phosphatase: 59 U/L (ref 38–126)
Anion gap: 7 (ref 5–15)
BUN: 22 mg/dL — ABNORMAL HIGH (ref 6–20)
CO2: 23 mmol/L (ref 22–32)
Calcium: 9.3 mg/dL (ref 8.9–10.3)
Chloride: 109 mmol/L (ref 98–111)
Creatinine: 0.78 mg/dL (ref 0.61–1.24)
GFR, Estimated: >60 mL/min (ref >60)
Glucose, Bld: 106 mg/dL — ABNORMAL HIGH (ref 70–99)
Potassium: 3.9 mmol/L (ref 3.5–5.1)
Sodium: 139 mmol/L (ref 135–145)
Total Bilirubin: 0.4 mg/dL (ref 0.0–1.2)
Total Protein: 6.3 g/dL — ABNORMAL LOW (ref 6.5–8.1)

## 2024-03-26 LAB — CBC WITH DIFFERENTIAL (CANCER CENTER ONLY)
Abs Immature Granulocytes: 0.02 K/uL (ref 0.00–0.07)
Basophils Absolute: 0 K/uL (ref 0.0–0.1)
Basophils Relative: 0 %
Eosinophils Absolute: 0 K/uL (ref 0.0–0.5)
Eosinophils Relative: 0 %
HCT: 33.9 % — ABNORMAL LOW (ref 39.0–52.0)
Hemoglobin: 11.5 g/dL — ABNORMAL LOW (ref 13.0–17.0)
Immature Granulocytes: 0 %
Lymphocytes Relative: 8 %
Lymphs Abs: 0.7 K/uL (ref 0.7–4.0)
MCH: 31.5 pg (ref 26.0–34.0)
MCHC: 33.9 g/dL (ref 30.0–36.0)
MCV: 92.9 fL (ref 80.0–100.0)
Monocytes Absolute: 0.6 K/uL (ref 0.1–1.0)
Monocytes Relative: 8 %
Neutro Abs: 6.8 K/uL (ref 1.7–7.7)
Neutrophils Relative %: 84 %
Platelet Count: 218 K/uL (ref 150–400)
RBC: 3.65 MIL/uL — ABNORMAL LOW (ref 4.22–5.81)
RDW: 14.6 % (ref 11.5–15.5)
WBC Count: 8.1 K/uL (ref 4.0–10.5)
nRBC: 0 % (ref 0.0–0.2)

## 2024-03-26 LAB — URINALYSIS, COMPLETE (UACMP) WITH MICROSCOPIC
Bacteria, UA: NONE SEEN
Bilirubin Urine: NEGATIVE
Glucose, UA: NEGATIVE mg/dL
Hgb urine dipstick: NEGATIVE
Ketones, ur: NEGATIVE mg/dL
Leukocytes,Ua: NEGATIVE
Nitrite: NEGATIVE
Protein, ur: NEGATIVE mg/dL
Specific Gravity, Urine: 1.011 (ref 1.005–1.030)
pH: 6 (ref 5.0–8.0)

## 2024-03-26 LAB — PSA: Prostatic Specific Antigen: 0.07 ng/mL (ref 0.00–4.00)

## 2024-03-26 MED ORDER — SODIUM CHLORIDE 0.9 % IV SOLN
75.0000 mg/m2 | Freq: Once | INTRAVENOUS | Status: AC
  Administered 2024-03-26: 160 mg via INTRAVENOUS
  Filled 2024-03-26: qty 16

## 2024-03-26 MED ORDER — SODIUM CHLORIDE 0.9 % IV SOLN: INTRAVENOUS | Status: DC

## 2024-03-26 MED ORDER — DEXAMETHASONE SOD PHOSPHATE PF 10 MG/ML IJ SOLN
10.0000 mg | Freq: Once | INTRAMUSCULAR | Status: AC
  Administered 2024-03-26: 10 mg via INTRAVENOUS

## 2024-03-26 NOTE — Progress Notes (Signed)
 [facility] Healthcare Advance Directives Clinical Social Work  Patient presented to Advance Directives Clinic  to review and complete healthcare advance directives.  Clinical Social Worker met with patient and pt's spouse.  The patient designated Cathlyn BIRCH Larue as their primary healthcare agent and Lavona V Javier (wife) as their secondary agent.  Patient also completed healthcare living will.    Documents were notarized and copies made for patient/family. Clinical Social Worker will send documents to medical records to be scanned into patient's chart. Clinical Social Worker encouraged patient/family to contact with any additional questions or concerns.   Fredericka Bottcher E Nathen Balaban, LCSW Clinical Social Worker [facility]

## 2024-03-26 NOTE — Assessment & Plan Note (Addendum)
 calcium (1000-1200 mg daily from food and supplements) and vitamin D3 (1000 IU daily) Routine dental care Healthy lifestyle to prevent diabetes and CV disease Weight-bearing exercises (30 minutes per day) Limit alcohol consumption and avoid smoking May use ginger ale, ginger tea for nausea prevention.

## 2024-03-26 NOTE — Patient Instructions (Signed)
 [facility] - A DEPT OF [facility]  Discharge Instructions: Thank you for choosing [facility] to provide your oncology and hematology care.   If you have a lab appointment with the Cancer Center, please go directly to the Cancer Center and check in at the registration area.   Wear comfortable clothing and clothing appropriate for easy access to any Portacath or PICC line.   We strive to give you quality time with your provider. You may need to reschedule your appointment if you arrive late (15 or more minutes).  Arriving late affects you and other patients whose appointments are after yours.  Also, if you miss three or more appointments without notifying the office, you may be dismissed from the clinic at the provider's discretion.      For prescription refill requests, have your pharmacy contact our office and allow 72 hours for refills to be completed.    Today you received the following chemotherapy and/or immunotherapy agents: docetaxel      To help prevent nausea and vomiting after your treatment, we encourage you to take your nausea medication as directed.  BELOW ARE SYMPTOMS THAT SHOULD BE REPORTED IMMEDIATELY: *FEVER GREATER THAN 100.4 F (38 C) OR HIGHER *CHILLS OR SWEATING *NAUSEA AND VOMITING THAT IS NOT CONTROLLED WITH YOUR NAUSEA MEDICATION *UNUSUAL SHORTNESS OF BREATH *UNUSUAL BRUISING OR BLEEDING *URINARY PROBLEMS (pain or burning when urinating, or frequent urination) *BOWEL PROBLEMS (unusual diarrhea, constipation, pain near the anus) TENDERNESS IN MOUTH AND THROAT WITH OR WITHOUT PRESENCE OF ULCERS (sore throat, sores in mouth, or a toothache) UNUSUAL RASH, SWELLING OR PAIN  UNUSUAL VAGINAL DISCHARGE OR ITCHING   Items with * indicate a potential emergency and should be followed up as soon as possible or go to the Emergency Department if any problems should occur.  Please show the CHEMOTHERAPY ALERT CARD or IMMUNOTHERAPY  ALERT CARD at check-in to the Emergency Department and triage nurse.  Should you have questions after your visit or need to cancel or reschedule your appointment, please contact [facility] - A DEPT OF [facility]  Dept: [phone]  and follow the prompts.  Office hours are 8:00 a.m. to 4:30 p.m. Monday - Friday. Please note that voicemails left after 4:00 p.m. may not be returned until the following business day.  We are closed weekends and major holidays. You have access to a nurse at all times for urgent questions. Please call the main number to the clinic Dept: [phone] and follow the prompts.   For any non-urgent questions, you may also contact your provider using MyChart. We now offer e-Visits for anyone 91 and older to request care online for non-urgent symptoms. For details visit [email or web address].   Also download the MyChart app! Go to the app store, search "MyChart", open the app, select [place], and log in with your MyChart username and password.

## 2024-03-26 NOTE — Assessment & Plan Note (Addendum)
 Resolved.   Will proceed with treatment today.

## 2024-03-27 ENCOUNTER — Encounter: Payer: Self-pay | Admitting: Radiology

## 2024-03-27 ENCOUNTER — Ambulatory Visit
Admission: RE | Admit: 2024-03-27 | Discharge: 2024-03-27 | Disposition: A | Discharge status: Home / Self Care | Source: Ambulatory Visit | Attending: Nurse Practitioner | Admitting: Nurse Practitioner

## 2024-03-27 DIAGNOSIS — R310 Gross hematuria: Secondary | ICD-10-CM

## 2024-03-27 DIAGNOSIS — C61 Malignant neoplasm of prostate: Secondary | ICD-10-CM

## 2024-03-27 DIAGNOSIS — N200 Calculus of kidney: Secondary | ICD-10-CM

## 2024-03-27 LAB — TESTOSTERONE: Testosterone: <3 ng/dL — ABNORMAL LOW (ref 264–916)

## 2024-03-27 MED ORDER — IOPAMIDOL (ISOVUE-300) INJECTION 61%
125.0000 mL | Freq: Once | INTRAVENOUS | Status: AC | PRN
  Administered 2024-03-27: 125 mL via INTRAVENOUS

## 2024-03-28 ENCOUNTER — Inpatient Hospital Stay

## 2024-03-28 VITALS — BP 117/70 | HR 56 | Temp 99.3°F | Resp 18

## 2024-03-28 DIAGNOSIS — C61 Malignant neoplasm of prostate: Secondary | ICD-10-CM

## 2024-03-28 DIAGNOSIS — Z5111 Encounter for antineoplastic chemotherapy: Secondary | ICD-10-CM | POA: Diagnosis not present

## 2024-03-28 MED ORDER — PEGFILGRASTIM-CBQV 6 MG/0.6ML ~~LOC~~ SOSY
6.0000 mg | PREFILLED_SYRINGE | Freq: Once | SUBCUTANEOUS | Status: AC
  Administered 2024-03-28: 6 mg via SUBCUTANEOUS
  Filled 2024-03-28: qty 0.6

## 2024-04-13 NOTE — Progress Notes (Signed)
 [facility] OFFICE PROGRESS NOTE  Patient Care Team: Blake Gee, NP as PCP - General (Nurse Practitioner) Blake Pont, RN as Oncology Nurse Navigator  Blake Maxwell is a 53 y.o.male with history of prostate cancer, CKD being seen at [facility] for prostate cancer. Initially with GS 4+5=9 GG5 in 2019 enrolled into PROTEUS trial with 6 months of neoadjuvant ADT +/- apalutamide followed by RP with BPLND. Final pathology showed ypT3bN1Mx GS 4+5=9 w/focal +margin 1/13 LN positive.  He had adjuvant fossa and pelvic node radiation in 05/2020 and SBRT to T7 and L2 oligometastatic lesions in 2023.  PSA up to 19.5 and PSMA showed multiple bone metastases in 05/2023.  Patient was started on apalutamide with ADT in January 2025 and developed neutropenia.  He was switched to darolutamide , and initiated triplet therapy given high-volume disease.   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   Neuropathy.  Resolved. Continue Cycle 5 today.   If neuropathy is not an issue, may proceed with last cycle of treatment on December 1.  If persistent neuropathy before that weekend, advised patient to call to postpone treatment for 1 to 2 weeks. Assessment & Plan Malignant neoplasm of prostate (HCC) Proceed with cycle 5 of docetaxel   If worsening neuropathy will postpone C6 Continue ADT, and darolutamide  HOXB13 mutations.  Last ADT was from AU with 6-mo dosage on 7/30. Will change to orgovyx in end of January.  Will see me in early January for follow-up and we will order Orgovyx at the time. Return for follow-up before cycle 6 of docetaxel  Neuropathy Resolved.   Will proceed with treatment today. At risk for side effect of medication calcium (1000-1200 mg daily from food and supplements) and vitamin D3 (1000 IU daily) Routine  dental care Healthy lifestyle to prevent diabetes and CV disease Weight-bearing exercises (30 minutes per day) Limit alcohol consumption and avoid smoking May use ginger ale, ginger tea for nausea prevention. Anemia due to antineoplastic chemotherapy Stable, will continue to monitor each cycle with labs before treatment.  Orders Placed This Encounter  Procedures   PSA    Standing Status:   Future    Expected Date:   05/14/2024    Expiration Date:   05/14/2025   Testosterone     Standing Status:   Future    Expected Date:   05/14/2024    Expiration Date:   05/14/2025   CBC with Differential (Cancer Center Only)    Standing Status:   Future    Expected Date:   05/14/2024    Expiration Date:   05/14/2025   CMP (Cancer Center only)    Standing Status:   Future    Expected Date:   05/14/2024    Expiration Date:   05/14/2025     Blake JAYSON Chihuahua, MD  INTERVAL HISTORY: Patient returns for follow-up.  Overall feeling well.  Neuropathy resolved.  No other significant side effects.  No fever, chills, chest pain, coughing, shortness of breath, abdominal pain, bowel issues.  He is doing pelvic exercises.  Oncology History  Malignant neoplasm of prostate (HCC)  05/09/2018 Initial Diagnosis   Malignant neoplasm of prostate (HCC)  Biopsy on 03/28/18 Highest GS 4+5=9 GG5 in 1 cores. 4+3 in 7 cores. Rest 3+4. All cores + prostate cancer.    12/25/2018 Cancer Staging   Enrolled into PROTEUS trial with 6 months of neoadjuvant  ADT +/- apalutamide.  Pretreatment PSA 56.3. 12/25/18 RP w/BPLND. ypT3bN1Mx GS 4+5=9 w/focal +margin 1/13 LN positive  Completed 6 months of adjuvant ADT +/- apa. Staging form: Prostate, AJCC 8th Edition - Pathologic stage from 12/25/2018: Stage IVA (pT3b, pN1, cM0, PSA: 56, Grade Group: 5) - Signed by Blake Rise, PA-C on 04/01/2020   04/2020 - 06/2020 Radiation Therapy   Adjuvant radiation (Dr. Patrcia)   03/2021 Progression   BCR   07/2021 PET scan   PSMA  PET ADDENDUM: In retrospect and comparison to PSMA PET scan of 01/31/2022, there is a radiotracer avid lesion in the L2 transverse process (RIGHT) with SUV max equal 11.7.   07/2021 PET scan   PSMA PET 1. New radiotracer avid skeletal metastasis in the T7 Vertebral body 2. Increased radiotracer activity at L2 transverse process skeletal metastasis. 3. No metastatic adenopathy in the pelvis periaortic retroperitoneum. 4. No visceral metastasis   07/20/2021 Tumor Marker   Psa 0.81   01/18/2022 Tumor Marker   PSA 1.83   03/08/2022 - 03/18/2022 Radiation Therapy   RT to oligometastatic lesions on T7 and L2   04/12/2022 Tumor Marker   PSA 1.26   07/06/2022 Tumor Marker   PSA 0.28   01/18/2023 Tumor Marker   PSA 12   03/21/2023 Tumor Marker   PSA 19.5   05/2023 Progression   PET noted progression of bone metastases.  1. Progression of skeletal radiotracer avid prostate cancer metastatic disease. New lesions in the thoracic spine, ribs and pelvis. Approximately 8-10 radiotracer avid lesions. 2. No evidence of metastatic adenopathy or visceral metastasis   06/2023 -  Chemotherapy   Started ADT with apa Developed neutropenia.   06/28/2023 Tumor Marker   PSA 57.2   09/27/2023 Tumor Marker   PSA 10.1 T 24.7   01/09/2024 -  Chemotherapy   Patient is on Treatment Plan : PROSTATE Docetaxel  (75) + Prednisone  q21d     03/29/2024 Cancer Staging   Staging form: Prostate, AJCC 8th Edition - Pathologic: Stage IVB (rpN0, rcM1b, Grade Group: 5) - Signed by Tina Blake BROCKS, MD on 03/29/2024 Stage prefix: Recurrence Histologic grading system: 5 grade system      PHYSICAL EXAMINATION: ECOG PERFORMANCE STATUS: 0  Vitals:   04/16/24 0935  BP: 116/68  Pulse: 60  Resp: 20  Temp: 97.7 F (36.5 C)  SpO2: 100%   Filed Weights   04/16/24 0935  Weight: 208 lb 14.4 oz (94.8 kg)    GENERAL: alert, no distress and comfortable SKIN: skin color normal and  LUNGS: clear to auscultation  and no wheeze or rales with normal breathing effort HEART: regular rate & rhythm  ABDOMEN: abdomen soft, non-tender and nondistended. Musculoskeletal: no edema  Relevant data reviewed during this visit included labs.

## 2024-04-16 ENCOUNTER — Inpatient Hospital Stay

## 2024-04-16 VITALS — BP 116/68 | HR 60 | Temp 97.7°F | Resp 20 | Wt 208.9 lb

## 2024-04-16 DIAGNOSIS — C61 Malignant neoplasm of prostate: Secondary | ICD-10-CM

## 2024-04-16 DIAGNOSIS — G629 Polyneuropathy, unspecified: Secondary | ICD-10-CM

## 2024-04-16 DIAGNOSIS — D6481 Anemia due to antineoplastic chemotherapy: Secondary | ICD-10-CM

## 2024-04-16 DIAGNOSIS — Z9189 Other specified personal risk factors, not elsewhere classified: Secondary | ICD-10-CM | POA: Diagnosis not present

## 2024-04-16 DIAGNOSIS — C7951 Secondary malignant neoplasm of bone: Secondary | ICD-10-CM | POA: Insufficient documentation

## 2024-04-16 DIAGNOSIS — Z5189 Encounter for other specified aftercare: Secondary | ICD-10-CM | POA: Diagnosis not present

## 2024-04-16 DIAGNOSIS — Z5111 Encounter for antineoplastic chemotherapy: Secondary | ICD-10-CM | POA: Insufficient documentation

## 2024-04-16 DIAGNOSIS — T451X5A Adverse effect of antineoplastic and immunosuppressive drugs, initial encounter: Secondary | ICD-10-CM

## 2024-04-16 LAB — CBC WITH DIFFERENTIAL (CANCER CENTER ONLY)
Abs Immature Granulocytes: 0.02 K/uL (ref 0.00–0.07)
Basophils Absolute: 0 K/uL (ref 0.0–0.1)
Basophils Relative: 0 %
Eosinophils Absolute: 0 K/uL (ref 0.0–0.5)
Eosinophils Relative: 0 %
HCT: 33.8 % — ABNORMAL LOW (ref 39.0–52.0)
Hemoglobin: 11.8 g/dL — ABNORMAL LOW (ref 13.0–17.0)
Immature Granulocytes: 0 %
Lymphocytes Relative: 6 %
Lymphs Abs: 0.4 K/uL — ABNORMAL LOW (ref 0.7–4.0)
MCH: 31.8 pg (ref 26.0–34.0)
MCHC: 34.9 g/dL (ref 30.0–36.0)
MCV: 91.1 fL (ref 80.0–100.0)
Monocytes Absolute: 0.4 K/uL (ref 0.1–1.0)
Monocytes Relative: 6 %
Neutro Abs: 5.7 K/uL (ref 1.7–7.7)
Neutrophils Relative %: 88 %
Platelet Count: 235 K/uL (ref 150–400)
RBC: 3.71 MIL/uL — ABNORMAL LOW (ref 4.22–5.81)
RDW: 14.8 % (ref 11.5–15.5)
WBC Count: 6.5 K/uL (ref 4.0–10.5)
nRBC: 0 % (ref 0.0–0.2)

## 2024-04-16 LAB — URINALYSIS, COMPLETE (UACMP) WITH MICROSCOPIC
Bacteria, UA: NONE SEEN
Bilirubin Urine: NEGATIVE
Glucose, UA: NEGATIVE mg/dL
Hgb urine dipstick: NEGATIVE
Ketones, ur: NEGATIVE mg/dL
Leukocytes,Ua: NEGATIVE
Nitrite: NEGATIVE
Protein, ur: NEGATIVE mg/dL
Specific Gravity, Urine: 1.013 (ref 1.005–1.030)
pH: 6 (ref 5.0–8.0)

## 2024-04-16 LAB — CMP (CANCER CENTER ONLY)
ALT: 16 U/L (ref 0–44)
AST: 16 U/L (ref 15–41)
Albumin: 4.1 g/dL (ref 3.5–5.0)
Alkaline Phosphatase: 62 U/L (ref 38–126)
Anion gap: 6 (ref 5–15)
BUN: 18 mg/dL (ref 6–20)
CO2: 22 mmol/L (ref 22–32)
Calcium: 9.2 mg/dL (ref 8.9–10.3)
Chloride: 110 mmol/L (ref 98–111)
Creatinine: 0.73 mg/dL (ref 0.61–1.24)
GFR, Estimated: >60 mL/min (ref >60)
Glucose, Bld: 110 mg/dL — ABNORMAL HIGH (ref 70–99)
Potassium: 4.2 mmol/L (ref 3.5–5.1)
Sodium: 138 mmol/L (ref 135–145)
Total Bilirubin: 0.4 mg/dL (ref 0.0–1.2)
Total Protein: 6.7 g/dL (ref 6.5–8.1)

## 2024-04-16 LAB — PSA: Prostatic Specific Antigen: 0.06 ng/mL (ref 0.00–4.00)

## 2024-04-16 MED ORDER — DEXAMETHASONE SOD PHOSPHATE PF 10 MG/ML IJ SOLN
10.0000 mg | Freq: Once | INTRAMUSCULAR | Status: AC
  Administered 2024-04-16: 10 mg via INTRAVENOUS

## 2024-04-16 MED ORDER — SODIUM CHLORIDE 0.9 % IV SOLN: INTRAVENOUS | Status: DC

## 2024-04-16 MED ORDER — SODIUM CHLORIDE 0.9 % IV SOLN
75.0000 mg/m2 | Freq: Once | INTRAVENOUS | Status: AC
  Administered 2024-04-16: 160 mg via INTRAVENOUS
  Filled 2024-04-16: qty 16

## 2024-04-16 NOTE — Assessment & Plan Note (Addendum)
 Proceed with cycle 5 of docetaxel   If worsening neuropathy will postpone C6 Continue ADT, and darolutamide  HOXB13 mutations.  Last ADT was from AU with 6-mo dosage on 7/30. Will change to orgovyx in end of January.  Will see me in early January for follow-up and we will order Orgovyx at the time. Return for follow-up before cycle 6 of docetaxel 

## 2024-04-16 NOTE — Patient Instructions (Signed)
 [facility] - A DEPT OF [facility]  Discharge Instructions: Thank you for choosing [facility] to provide your oncology and hematology care.   If you have a lab appointment with the Cancer Center, please go directly to the Cancer Center and check in at the registration area.   Wear comfortable clothing and clothing appropriate for easy access to any Portacath or PICC line.   We strive to give you quality time with your provider. You may need to reschedule your appointment if you arrive late (15 or more minutes).  Arriving late affects you and other patients whose appointments are after yours.  Also, if you miss three or more appointments without notifying the office, you may be dismissed from the clinic at the provider's discretion.      For prescription refill requests, have your pharmacy contact our office and allow 72 hours for refills to be completed.    Today you received the following chemotherapy and/or immunotherapy agents taxotere .      To help prevent nausea and vomiting after your treatment, we encourage you to take your nausea medication as directed.  BELOW ARE SYMPTOMS THAT SHOULD BE REPORTED IMMEDIATELY: *FEVER GREATER THAN 100.4 F (38 C) OR HIGHER *CHILLS OR SWEATING *NAUSEA AND VOMITING THAT IS NOT CONTROLLED WITH YOUR NAUSEA MEDICATION *UNUSUAL SHORTNESS OF BREATH *UNUSUAL BRUISING OR BLEEDING *URINARY PROBLEMS (pain or burning when urinating, or frequent urination) *BOWEL PROBLEMS (unusual diarrhea, constipation, pain near the anus) TENDERNESS IN MOUTH AND THROAT WITH OR WITHOUT PRESENCE OF ULCERS (sore throat, sores in mouth, or a toothache) UNUSUAL RASH, SWELLING OR PAIN  UNUSUAL VAGINAL DISCHARGE OR ITCHING   Items with * indicate a potential emergency and should be followed up as soon as possible or go to the Emergency Department if any problems should occur.  Please show the CHEMOTHERAPY ALERT CARD or IMMUNOTHERAPY  ALERT CARD at check-in to the Emergency Department and triage nurse.  Should you have questions after your visit or need to cancel or reschedule your appointment, please contact [facility] - A DEPT OF [facility]  Dept: [phone]  and follow the prompts.  Office hours are 8:00 a.m. to 4:30 p.m. Monday - Friday. Please note that voicemails left after 4:00 p.m. may not be returned until the following business day.  We are closed weekends and major holidays. You have access to a nurse at all times for urgent questions. Please call the main number to the clinic Dept: [phone] and follow the prompts.   For any non-urgent questions, you may also contact your provider using MyChart. We now offer e-Visits for anyone 57 and older to request care online for non-urgent symptoms. For details visit [email or web address].   Also download the MyChart app! Go to the app store, search "MyChart", open the app, select [place], and log in with your MyChart username and password.

## 2024-04-16 NOTE — Assessment & Plan Note (Addendum)
 Stable, will continue to monitor each cycle with labs before treatment.

## 2024-04-16 NOTE — Assessment & Plan Note (Addendum)
 Resolved.   Will proceed with treatment today.

## 2024-04-16 NOTE — Assessment & Plan Note (Addendum)
 calcium (1000-1200 mg daily from food and supplements) and vitamin D3 (1000 IU daily) Routine dental care Healthy lifestyle to prevent diabetes and CV disease Weight-bearing exercises (30 minutes per day) Limit alcohol consumption and avoid smoking May use ginger ale, ginger tea for nausea prevention.

## 2024-04-17 ENCOUNTER — Other Ambulatory Visit: Payer: Self-pay

## 2024-04-17 LAB — TESTOSTERONE: Testosterone: <3 ng/dL — ABNORMAL LOW (ref 264–916)

## 2024-04-18 ENCOUNTER — Inpatient Hospital Stay (HOSPITAL_BASED_OUTPATIENT_CLINIC_OR_DEPARTMENT_OTHER)

## 2024-04-18 VITALS — BP 103/64 | HR 60 | Temp 98.2°F | Resp 18

## 2024-04-18 DIAGNOSIS — Z5111 Encounter for antineoplastic chemotherapy: Secondary | ICD-10-CM | POA: Diagnosis not present

## 2024-04-18 DIAGNOSIS — C61 Malignant neoplasm of prostate: Secondary | ICD-10-CM

## 2024-04-18 LAB — URINE CULTURE: Culture: 20000 — AB

## 2024-04-18 MED ORDER — PEGFILGRASTIM-CBQV 6 MG/0.6ML ~~LOC~~ SOSY
6.0000 mg | PREFILLED_SYRINGE | Freq: Once | SUBCUTANEOUS | Status: AC
  Administered 2024-04-18: 6 mg via SUBCUTANEOUS
  Filled 2024-04-18: qty 0.6

## 2024-04-19 ENCOUNTER — Ambulatory Visit: Payer: Self-pay

## 2024-04-19 NOTE — Telephone Encounter (Signed)
 Blake Maxwell states he is not having any UTI symptoms.

## 2024-04-19 NOTE — Telephone Encounter (Signed)
-----   Message from [name] sent at 04/19/2024  1:48 PM EST ----- Blake Maxwell please check on him if he has no new UTI symptoms, we don't need to treat. If needed. Cipro 500 mg twice daily for 5 days. Thanks.  ----- Message ----- From: Rebecka, Lab In [place] Sent: 04/16/2024   9:21 AM EST To: [name], MD

## 2024-05-07 ENCOUNTER — Telehealth: Payer: Self-pay

## 2024-05-07 NOTE — Telephone Encounter (Signed)
 T/C from pt just wanting to make you aware that on Sunday he had swelling in his left ankle.  It has resolved, but he wanted you to know

## 2024-05-13 NOTE — Assessment & Plan Note (Signed)
 Proceed with cycle 5 of docetaxel   If worsening neuropathy will postpone C6 Continue ADT, and darolutamide  HOXB13 mutations.  Last ADT was from [facility] with 6-mo dosage on 7/30. Will change to orgovyx in end of January.  Will see me in early January for follow-up and we will order Orgovyx at the time. Return for follow-up before cycle 6 of docetaxel 

## 2024-05-13 NOTE — Progress Notes (Unsigned)
 Patient Care Team: [name], NP as PCP - General (Nurse Practitioner) Vertell Pont, RN as Oncology Nurse Navigator  Clinic Day:  05/14/2024  Referring physician: Tina Pauletta BROCKS, MD  ASSESSMENT & PLAN:   Assessment & Plan: Malignant neoplasm of prostate (HCC) -cbc showing mild and stable anemia.  -CMP is unremarkable.  -PSA and testosterone  levels pending.  Improved peripheral neuropathy.  Proceed with Cycle 6 Docetaxel . Continue ADT, and darolutamide . Next Lupron injection scheduled for 05/16/2024.  HOXB13 mutations.  Last ADT was from [facility] with 6-mo dosage on 7/30. Will change to orgovyx in end of January.  Will see me in early January for follow-up and we will order Orgovyx at the time. Labs with follow up in January with Dr. Tina as sche   Peripheral neuropathy The patient has been experiencing peripheral neuropathy, worse in hands than feet. He has started doing ice baths for his extremities during treatment. He feels like this has helped tremendously. He would like to proceed with Cycle 6 Docetaxol today.   Anemia The patient has mild and stable anemia. No requirement for blood transfusion or iron infusions. Will continue to monitor closely.   Plan Labs reviewed.   -cbc showing mild and stable anemia.  -CMP is unremarkable.  -PSA and testosterone  levels pending.  Improved peripheral neuropathy.  Proceed with Cycle 6 Docetaxel .  Labs with follow up in January with Dr. Tina as scheduled.   The patient understands the plans discussed today and is in agreement with them.  He knows to contact our office if he develops concerns prior to his next appointment.  I provided 25 minutes of face-to-face time during this encounter and > 50% was spent counseling as documented under my assessment and plan.    Blake FORBES Lessen, NP  [facility] - A DEPT OF [facility] [address] [place] [address] Dept:  [phone] Dept Fax: [phone]   No orders of the defined types were placed in this encounter.     CHIEF COMPLAINT:  CC: prostate cancer   Current Treatment:  ADT and darolutamide    INTERVAL HISTORY:  Blake Maxwell is here today for repeat clinical assessment. He  last saw Dr. Tina on 04/16/2024. He reports feeling well overall. States that peripheral neuropathy has improved since he was last seen. Did ice baths for extremities during chemotherapy the last time. This seemed to really make a difference. He denies chest pain, chest pressure, or shortness of breath. He denies headaches or visual disturbances. He denies abdominal pain, nausea, vomiting, or changes in bowel or bladder habits.  He denies fevers or chills. He denies pain. His appetite is good. He states that his tastes have changed. Many foods are bland in flavor. States that he eats it anyway because he understands he needs to keep up his protein and calorie intake.  His weight has increased 3 pounds over last month. .  I have reviewed the past medical history, past surgical history, social history and family history with the patient and they are unchanged from previous note.  ALLERGIES:  has no known allergies.  MEDICATIONS:  Current Outpatient Medications  Medication Sig Dispense Refill   darolutamide  (NUBEQA ) 300 MG tablet Take 2 tablets (600 mg total) by mouth 2 (two) times daily with a meal. 120 tablet 11   dexamethasone  (DECADRON ) 4 MG tablet Take 2 tabs by mouth 2 times daily starting day before chemo. Then take 2 tabs  daily for 2 days starting day after chemo. Take with food. 30 tablet 1   ondansetron  (ZOFRAN ) 8 MG tablet Take 1 tablet (8 mg total) by mouth every 8 (eight) hours as needed for nausea or vomiting. 30 tablet 1   prochlorperazine  (COMPAZINE ) 10 MG tablet Take 1 tablet (10 mg total) by mouth every 6 (six) hours as needed for nausea or vomiting. 30 tablet 1   sulfamethoxazole -trimethoprim  (BACTRIM  DS) 800-160 MG  tablet Take 1 tablet by mouth 2 (two) times daily. Take for 5 days 10 tablet 0   No current facility-administered medications for this visit.   Facility-Administered Medications Ordered in Other Visits  Medication Dose Route Frequency Provider Last Rate Last Admin   0.9 %  sodium chloride  infusion   Intravenous Continuous Blake Pauletta BROCKS, MD 10 mL/hr at 05/14/24 0915 New Bag at 05/14/24 0915   DOCEtaxel  (TAXOTERE ) 160 mg in sodium chloride  0.9 % 250 mL chemo infusion  75 mg/m2 (Order-Specific) Intravenous Once Blake Pauletta BROCKS, MD        HISTORY OF PRESENT ILLNESS:   Oncology History  Malignant neoplasm of prostate (HCC)  05/09/2018 Initial Diagnosis   Malignant neoplasm of prostate (HCC)  Biopsy on 03/28/18 Highest GS 4+5=9 GG5 in 1 cores. 4+3 in 7 cores. Rest 3+4. All cores + prostate cancer.    12/25/2018 Cancer Staging   Enrolled into PROTEUS trial with 6 months of neoadjuvant ADT +/- apalutamide.  Pretreatment PSA 56.3. 12/25/18 RP w/BPLND. ypT3bN1Mx GS 4+5=9 w/focal +margin 1/13 LN positive  Completed 6 months of adjuvant ADT +/- apa. Staging form: Prostate, AJCC 8th Edition - Pathologic stage from 12/25/2018: Stage IVA (pT3b, pN1, cM0, PSA: 56, Grade Group: 5) - Signed by Sherwood Rise, PA-C on 04/01/2020   04/2020 - 06/2020 Radiation Therapy   Adjuvant radiation (Dr. Patrcia)   03/2021 Progression   BCR   07/2021 PET scan   PSMA PET ADDENDUM: In retrospect and comparison to PSMA PET scan of 01/31/2022, there is a radiotracer avid lesion in the L2 transverse process (RIGHT) with SUV max equal 11.7.   07/2021 PET scan   PSMA PET 1. New radiotracer avid skeletal metastasis in the T7 Vertebral body 2. Increased radiotracer activity at L2 transverse process skeletal metastasis. 3. No metastatic adenopathy in the pelvis periaortic retroperitoneum. 4. No visceral metastasis   07/20/2021 Tumor Marker   Psa 0.81   01/18/2022 Tumor Marker   PSA 1.83   03/08/2022 -  03/18/2022 Radiation Therapy   RT to oligometastatic lesions on T7 and L2   04/12/2022 Tumor Marker   PSA 1.26   07/06/2022 Tumor Marker   PSA 0.28   01/18/2023 Tumor Marker   PSA 12   03/21/2023 Tumor Marker   PSA 19.5   05/2023 Progression   PET noted progression of bone metastases.  1. Progression of skeletal radiotracer avid prostate cancer metastatic disease. New lesions in the thoracic spine, ribs and pelvis. Approximately 8-10 radiotracer avid lesions. 2. No evidence of metastatic adenopathy or visceral metastasis   06/2023 -  Chemotherapy   Started ADT with apa Developed neutropenia.   06/28/2023 Tumor Marker   PSA 57.2   09/27/2023 Tumor Marker   PSA 10.1 T 24.7   01/09/2024 -  Chemotherapy   Patient is on Treatment Plan : PROSTATE Docetaxel  (73) + Prednisone  q21d     03/29/2024 Cancer Staging   Staging form: Prostate, AJCC 8th Edition - Pathologic: Stage IVB (rpN0, rcM1b, Grade Group: 5) - Signed by Blake,  Pauletta BROCKS, MD on 03/29/2024 Stage prefix: Recurrence Histologic grading system: 5 grade system       REVIEW OF SYSTEMS:   Constitutional: Denies fevers, chills or abnormal weight loss Eyes: Denies blurriness of vision Ears, nose, mouth, throat, and face: Denies mucositis or sore throat Respiratory: Denies cough, dyspnea or wheezes Cardiovascular: Denies palpitation, chest discomfort or lower extremity swelling Gastrointestinal:  Denies nausea, heartburn or change in bowel habits Skin: Denies abnormal skin rashes Lymphatics: Denies new lymphadenopathy or easy bruising Neurological:Denies numbness, tingling or new weaknesses Behavioral/Psych: Mood is stable, no new changes  All other systems were reviewed with the patient and are negative.   VITALS:   Today's Vitals   05/14/24 0832 05/14/24 0838  BP: 122/70   Pulse: 72   Resp: 17   Temp: 98.5 F (36.9 C)   SpO2: 99%   Weight: 211 lb 11.2 oz (96 kg)   PainSc:  0-No pain   Body mass index is  31.26 kg/m.   Wt Readings from Last 3 Encounters:  05/14/24 211 lb 11.2 oz (96 kg)  04/16/24 208 lb 14.4 oz (94.8 kg)  03/26/24 206 lb (93.4 kg)    Body mass index is 31.26 kg/m.  Performance status (ECOG): 1 - Symptomatic but completely ambulatory  PHYSICAL EXAM:   GENERAL:alert, no distress and comfortable SKIN: skin color, texture, turgor are normal, no rashes or significant lesions EYES: normal, Conjunctiva are pink and non-injected, sclera clear OROPHARYNX:no exudate, no erythema and lips, buccal mucosa, and tongue normal  NECK: supple, thyroid normal size, non-tender, without nodularity LYMPH:  no palpable lymphadenopathy in the cervical, axillary or inguinal LUNGS: clear to auscultation and percussion with normal breathing effort HEART: regular rate & rhythm and no murmurs and no lower extremity edema ABDOMEN:abdomen soft, non-tender and normal bowel sounds Musculoskeletal:no cyanosis of digits and no clubbing  NEURO: alert & oriented x 3 with fluent speech, no focal motor/sensory deficits  LABORATORY DATA:  I have reviewed the data as listed    Component Value Date/Time   NA 140 05/14/2024 0816   K 4.1 05/14/2024 0816   CL 109 05/14/2024 0816   CO2 20 (L) 05/14/2024 0816   GLUCOSE 92 05/14/2024 0816   BUN 15 05/14/2024 0816   CREATININE 0.77 05/14/2024 0816   CALCIUM 9.4 05/14/2024 0816   PROT 6.6 05/14/2024 0816   ALBUMIN 4.3 05/14/2024 0816   AST 20 05/14/2024 0816   ALT 27 05/14/2024 0816   ALKPHOS 62 05/14/2024 0816   BILITOT 0.4 05/14/2024 0816   GFRNONAA >60 05/14/2024 0816   GFRAA >60 12/20/2018 1058     Lab Results  Component Value Date   WBC 5.5 05/14/2024   NEUTROABS 4.4 05/14/2024   HGB 12.2 (L) 05/14/2024   HCT 35.3 (L) 05/14/2024   MCV 92.4 05/14/2024   PLT 201 05/14/2024

## 2024-05-14 ENCOUNTER — Inpatient Hospital Stay

## 2024-05-14 ENCOUNTER — Encounter: Payer: Self-pay | Admitting: Nurse Practitioner

## 2024-05-14 ENCOUNTER — Inpatient Hospital Stay: Admitting: Nurse Practitioner

## 2024-05-14 VITALS — BP 122/70 | HR 72 | Temp 98.5°F | Resp 17 | Wt 211.7 lb

## 2024-05-14 DIAGNOSIS — C7951 Secondary malignant neoplasm of bone: Secondary | ICD-10-CM | POA: Insufficient documentation

## 2024-05-14 DIAGNOSIS — C61 Malignant neoplasm of prostate: Secondary | ICD-10-CM | POA: Insufficient documentation

## 2024-05-14 DIAGNOSIS — Z79818 Long term (current) use of other agents affecting estrogen receptors and estrogen levels: Secondary | ICD-10-CM | POA: Diagnosis not present

## 2024-05-14 DIAGNOSIS — Z5111 Encounter for antineoplastic chemotherapy: Secondary | ICD-10-CM | POA: Diagnosis present

## 2024-05-14 LAB — CBC WITH DIFFERENTIAL (CANCER CENTER ONLY)
Abs Immature Granulocytes: 0.01 K/uL (ref 0.00–0.07)
Basophils Absolute: 0 K/uL (ref 0.0–0.1)
Basophils Relative: 0 %
Eosinophils Absolute: 0 K/uL (ref 0.0–0.5)
Eosinophils Relative: 0 %
HCT: 35.3 % — ABNORMAL LOW (ref 39.0–52.0)
Hemoglobin: 12.2 g/dL — ABNORMAL LOW (ref 13.0–17.0)
Immature Granulocytes: 0 %
Lymphocytes Relative: 10 %
Lymphs Abs: 0.6 K/uL — ABNORMAL LOW (ref 0.7–4.0)
MCH: 31.9 pg (ref 26.0–34.0)
MCHC: 34.6 g/dL (ref 30.0–36.0)
MCV: 92.4 fL (ref 80.0–100.0)
Monocytes Absolute: 0.5 K/uL (ref 0.1–1.0)
Monocytes Relative: 10 %
Neutro Abs: 4.4 K/uL (ref 1.7–7.7)
Neutrophils Relative %: 80 %
Platelet Count: 201 K/uL (ref 150–400)
RBC: 3.82 MIL/uL — ABNORMAL LOW (ref 4.22–5.81)
RDW: 15.4 % (ref 11.5–15.5)
WBC Count: 5.5 K/uL (ref 4.0–10.5)
nRBC: 0 % (ref 0.0–0.2)

## 2024-05-14 LAB — CMP (CANCER CENTER ONLY)
ALT: 27 U/L (ref 0–44)
AST: 20 U/L (ref 15–41)
Albumin: 4.3 g/dL (ref 3.5–5.0)
Alkaline Phosphatase: 62 U/L (ref 38–126)
Anion gap: 11 (ref 5–15)
BUN: 15 mg/dL (ref 6–20)
CO2: 20 mmol/L — ABNORMAL LOW (ref 22–32)
Calcium: 9.4 mg/dL (ref 8.9–10.3)
Chloride: 109 mmol/L (ref 98–111)
Creatinine: 0.77 mg/dL (ref 0.61–1.24)
GFR, Estimated: >60 mL/min (ref >60)
Glucose, Bld: 92 mg/dL (ref 70–99)
Potassium: 4.1 mmol/L (ref 3.5–5.1)
Sodium: 140 mmol/L (ref 135–145)
Total Bilirubin: 0.4 mg/dL (ref 0.0–1.2)
Total Protein: 6.6 g/dL (ref 6.5–8.1)

## 2024-05-14 LAB — PSA: Prostatic Specific Antigen: 0.03 ng/mL (ref 0.00–4.00)

## 2024-05-14 MED ORDER — DEXAMETHASONE SOD PHOSPHATE PF 10 MG/ML IJ SOLN
10.0000 mg | Freq: Once | INTRAMUSCULAR | Status: AC
  Administered 2024-05-14: 10 mg via INTRAVENOUS

## 2024-05-14 MED ORDER — SODIUM CHLORIDE 0.9 % IV SOLN
75.0000 mg/m2 | Freq: Once | INTRAVENOUS | Status: AC
  Administered 2024-05-14: 160 mg via INTRAVENOUS
  Filled 2024-05-14: qty 16

## 2024-05-14 MED ORDER — SODIUM CHLORIDE 0.9 % IV SOLN: INTRAVENOUS | Status: DC

## 2024-05-14 NOTE — Patient Instructions (Signed)
 [facility] - A DEPT OF [facility]  Discharge Instructions: Thank you for choosing [facility] to provide your oncology and hematology care.   If you have a lab appointment with the Cancer Center, please go directly to the Cancer Center and check in at the registration area.   Wear comfortable clothing and clothing appropriate for easy access to any Portacath or PICC line.   We strive to give you quality time with your provider. You may need to reschedule your appointment if you arrive late (15 or more minutes).  Arriving late affects you and other patients whose appointments are after yours.  Also, if you miss three or more appointments without notifying the office, you may be dismissed from the clinic at the provider's discretion.      For prescription refill requests, have your pharmacy contact our office and allow 72 hours for refills to be completed.    Today you received the following chemotherapy and/or immunotherapy agents Taxotere.      To help prevent nausea and vomiting after your treatment, we encourage you to take your nausea medication as directed.  BELOW ARE SYMPTOMS THAT SHOULD BE REPORTED IMMEDIATELY: *FEVER GREATER THAN 100.4 F (38 C) OR HIGHER *CHILLS OR SWEATING *NAUSEA AND VOMITING THAT IS NOT CONTROLLED WITH YOUR NAUSEA MEDICATION *UNUSUAL SHORTNESS OF BREATH *UNUSUAL BRUISING OR BLEEDING *URINARY PROBLEMS (pain or burning when urinating, or frequent urination) *BOWEL PROBLEMS (unusual diarrhea, constipation, pain near the anus) TENDERNESS IN MOUTH AND THROAT WITH OR WITHOUT PRESENCE OF ULCERS (sore throat, sores in mouth, or a toothache) UNUSUAL RASH, SWELLING OR PAIN  UNUSUAL VAGINAL DISCHARGE OR ITCHING   Items with * indicate a potential emergency and should be followed up as soon as possible or go to the Emergency Department if any problems should occur.  Please show the CHEMOTHERAPY ALERT CARD or IMMUNOTHERAPY  ALERT CARD at check-in to the Emergency Department and triage nurse.  Should you have questions after your visit or need to cancel or reschedule your appointment, please contact [facility] - A DEPT OF [facility]  Dept: [phone]  and follow the prompts.  Office hours are 8:00 a.m. to 4:30 p.m. Monday - Friday. Please note that voicemails left after 4:00 p.m. may not be returned until the following business day.  We are closed weekends and major holidays. You have access to a nurse at all times for urgent questions. Please call the main number to the clinic Dept: [phone] and follow the prompts.   For any non-urgent questions, you may also contact your provider using MyChart. We now offer e-Visits for anyone 69 and older to request care online for non-urgent symptoms. For details visit [email or web address].   Also download the MyChart app! Go to the app store, search "MyChart", open the app, select [place], and log in with your MyChart username and password.

## 2024-05-15 LAB — TESTOSTERONE: Testosterone: <3 ng/dL — ABNORMAL LOW (ref 264–916)

## 2024-05-16 ENCOUNTER — Inpatient Hospital Stay

## 2024-05-16 VITALS — BP 106/63 | HR 55 | Temp 98.9°F | Resp 18

## 2024-05-16 DIAGNOSIS — C61 Malignant neoplasm of prostate: Secondary | ICD-10-CM

## 2024-05-16 DIAGNOSIS — Z5111 Encounter for antineoplastic chemotherapy: Secondary | ICD-10-CM | POA: Diagnosis not present

## 2024-05-16 MED ORDER — PEGFILGRASTIM-CBQV 6 MG/0.6ML ~~LOC~~ SOSY
6.0000 mg | PREFILLED_SYRINGE | Freq: Once | SUBCUTANEOUS | Status: AC
  Administered 2024-05-16: 6 mg via SUBCUTANEOUS
  Filled 2024-05-16: qty 0.6

## 2024-05-18 ENCOUNTER — Other Ambulatory Visit: Payer: Self-pay

## 2024-06-01 ENCOUNTER — Other Ambulatory Visit: Payer: Self-pay

## 2024-06-01 DIAGNOSIS — C61 Malignant neoplasm of prostate: Secondary | ICD-10-CM

## 2024-06-15 NOTE — Assessment & Plan Note (Addendum)
 calcium (1000-1200 mg daily from food and supplements) and vitamin D3 (1000 IU daily) Routine dental care Healthy lifestyle to prevent diabetes and CV disease Weight-bearing exercises (30 minutes per day) Limit alcohol consumption and avoid smoking May use ginger ale, ginger tea for nausea prevention.

## 2024-06-15 NOTE — Assessment & Plan Note (Addendum)
"   Improving  "

## 2024-06-15 NOTE — Progress Notes (Addendum)
 [facility] OFFICE PROGRESS NOTE  Patient Care Team: [name], NP as PCP - General (Nurse Practitioner) Vertell Pont, RN as Oncology Nurse Navigator  Addendum  Report patient cannot get Orgovyx  approved due to insurance plan.  Planes of Lupron every 3 months signed.  Blake Maxwell is a 54 y.o.male with history of prostate cancer, CKD being seen at [facility] for prostate cancer. Initially with GS 4+5=9 GG5 in 2019 enrolled into PROTEUS trial with 6 months of neoadjuvant ADT +/- apalutamide followed by RP with BPLND. Final pathology showed ypT3bN1Mx GS 4+5=9 w/focal +margin 1/13 LN positive.  He had adjuvant fossa and pelvic node radiation in 05/2020 and SBRT to T7 and L2 oligometastatic lesions in 2023.   Now completed 6 cycles of docetaxel .  PSA up to 19.5 and PSMA showed multiple bone metastases in 05/2023.  Patient was started on apalutamide with ADT in January 2025 and developed neutropenia.  He was switched to darolutamide , and initiated triplet therapy given high-volume disease.   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  Overall feeling well.  Some neuropathy but getting better.  No signs of infection.  Will start Orgovyx .  Continue darolutamide . Assessment & Plan Malignant neoplasm of prostate (HCC) Completed cycle 6 Docetaxel . Will start Orgovyx  as ADT, and darolutamide .  HOXB13 mutations.  Labs with follow up in January with Dr. Tina as sche Neuropathy Improving.    At risk for side effect of medication calcium (1000-1200 mg daily from food and supplements) and vitamin D3 (1000 IU daily) Routine dental care Healthy lifestyle to prevent diabetes and CV disease Weight-bearing exercises (30 minutes per day) Limit alcohol consumption and avoid smoking May use ginger ale, ginger tea for  nausea prevention. Chemotherapy-induced neutropenia Moderate neutropenia. No signs of infection. Repeat labs next month.  Orders Placed This Encounter  Procedures   CBC with Differential (Cancer Center Only)    Standing Status:   Future    Expiration Date:   06/18/2025   CMP (Cancer Center only)    Standing Status:   Future    Expiration Date:   06/18/2025   Testosterone     Standing Status:   Future    Expiration Date:   06/18/2025   PSA    Standing Status:   Future    Expiration Date:   06/18/2025     Pauletta JAYSON Tina, MD  INTERVAL HISTORY: Patient returns for follow-up. Some neuropathy in the hands and toes comes and goes. A little swelling if wearing compressing socks help. No fever, chills, coughing, short of breath or diarrhea. No new bone or back pain. No difficulty urinating.  Oncology History  Malignant neoplasm of prostate (HCC)  05/09/2018 Initial Diagnosis   Malignant neoplasm of prostate (HCC)  Biopsy on 03/28/18 Highest GS 4+5=9 GG5 in 1 cores. 4+3 in 7 cores. Rest 3+4. All cores + prostate cancer.    12/25/2018 Cancer Staging   Enrolled into PROTEUS trial with 6 months of neoadjuvant ADT +/- apalutamide.  Pretreatment PSA 56.3. 12/25/18 RP w/BPLND. ypT3bN1Mx GS 4+5=9 w/focal +margin 1/13 LN positive  Completed 6 months of adjuvant ADT +/- apa. Staging form: Prostate, AJCC 8th Edition - Pathologic stage from 12/25/2018: Stage IVA (pT3b, pN1, cM0, PSA: 56, Grade Group: 5) - Signed by Sherwood Rise, PA-C on 04/01/2020   04/2020 - 06/2020 Radiation Therapy   Adjuvant radiation (Dr. Patrcia)  03/2021 Progression   BCR   07/2021 PET scan   PSMA PET ADDENDUM: In retrospect and comparison to PSMA PET scan of 01/31/2022, there is a radiotracer avid lesion in the L2 transverse process (RIGHT) with SUV max equal 11.7.   07/2021 PET scan   PSMA PET 1. New radiotracer avid skeletal metastasis in the T7 Vertebral body 2. Increased radiotracer activity at L2 transverse  process skeletal metastasis. 3. No metastatic adenopathy in the pelvis periaortic retroperitoneum. 4. No visceral metastasis   07/20/2021 Tumor Marker   Psa 0.81   01/18/2022 Tumor Marker   PSA 1.83   03/08/2022 - 03/18/2022 Radiation Therapy   RT to oligometastatic lesions on T7 and L2   04/12/2022 Tumor Marker   PSA 1.26   07/06/2022 Tumor Marker   PSA 0.28   01/18/2023 Tumor Marker   PSA 12   03/21/2023 Tumor Marker   PSA 19.5   05/2023 Progression   PET noted progression of bone metastases.  1. Progression of skeletal radiotracer avid prostate cancer metastatic disease. New lesions in the thoracic spine, ribs and pelvis. Approximately 8-10 radiotracer avid lesions. 2. No evidence of metastatic adenopathy or visceral metastasis   06/2023 -  Chemotherapy   Started ADT with apa Developed neutropenia.   06/28/2023 Tumor Marker   PSA 57.2   09/27/2023 Tumor Marker   PSA 10.1 T 24.7   01/09/2024 -  Chemotherapy   Patient is on Treatment Plan : PROSTATE Docetaxel  (73) + Prednisone  q21d     03/29/2024 Cancer Staging   Staging form: Prostate, AJCC 8th Edition - Pathologic: Stage IVB (rpN0, rcM1b, Grade Group: 5) - Signed by Tina Pauletta BROCKS, MD on 03/29/2024 Stage prefix: Recurrence Histologic grading system: 5 grade system      PHYSICAL EXAMINATION: ECOG PERFORMANCE STATUS: 0  Vitals:   06/18/24 0921  BP: 113/75  Pulse: (!) 56  Resp: 18  Temp: (!) 97.3 F (36.3 C)  SpO2: 100%   Filed Weights   06/18/24 0921  Weight: 211 lb 14.4 oz (96.1 kg)    GENERAL: alert, no distress and comfortable LUNGS: clear to auscultation and no wheeze or rales with normal breathing effort HEART: regular rate & rhythm  ABDOMEN: abdomen soft, non-tender and nondistended. Musculoskeletal: no edema  Relevant data reviewed during this visit included labs.  New labs ordered.

## 2024-06-15 NOTE — Assessment & Plan Note (Addendum)
 Completed cycle 6 Docetaxel . Will start Orgovyx  as ADT, and darolutamide .  HOXB13 mutations.  Labs with follow up in January with Dr. Tina as sche

## 2024-06-18 ENCOUNTER — Inpatient Hospital Stay

## 2024-06-18 ENCOUNTER — Telehealth: Payer: Self-pay

## 2024-06-18 ENCOUNTER — Other Ambulatory Visit (HOSPITAL_COMMUNITY): Payer: Self-pay

## 2024-06-18 ENCOUNTER — Inpatient Hospital Stay (HOSPITAL_BASED_OUTPATIENT_CLINIC_OR_DEPARTMENT_OTHER)

## 2024-06-18 VITALS — BP 113/75 | HR 56 | Temp 97.3°F | Resp 18 | Wt 211.9 lb

## 2024-06-18 DIAGNOSIS — D709 Neutropenia, unspecified: Secondary | ICD-10-CM | POA: Insufficient documentation

## 2024-06-18 DIAGNOSIS — C61 Malignant neoplasm of prostate: Secondary | ICD-10-CM | POA: Insufficient documentation

## 2024-06-18 DIAGNOSIS — Z5111 Encounter for antineoplastic chemotherapy: Secondary | ICD-10-CM | POA: Insufficient documentation

## 2024-06-18 DIAGNOSIS — Z9189 Other specified personal risk factors, not elsewhere classified: Secondary | ICD-10-CM | POA: Diagnosis not present

## 2024-06-18 DIAGNOSIS — G629 Polyneuropathy, unspecified: Secondary | ICD-10-CM | POA: Diagnosis not present

## 2024-06-18 DIAGNOSIS — Z79818 Long term (current) use of other agents affecting estrogen receptors and estrogen levels: Secondary | ICD-10-CM | POA: Insufficient documentation

## 2024-06-18 DIAGNOSIS — D701 Agranulocytosis secondary to cancer chemotherapy: Secondary | ICD-10-CM

## 2024-06-18 DIAGNOSIS — T451X5A Adverse effect of antineoplastic and immunosuppressive drugs, initial encounter: Secondary | ICD-10-CM | POA: Diagnosis not present

## 2024-06-18 DIAGNOSIS — C7951 Secondary malignant neoplasm of bone: Secondary | ICD-10-CM | POA: Diagnosis present

## 2024-06-18 LAB — CBC WITH DIFFERENTIAL (CANCER CENTER ONLY)
Abs Immature Granulocytes: 0 K/uL (ref 0.00–0.07)
Basophils Absolute: 0 K/uL (ref 0.0–0.1)
Basophils Relative: 1 %
Eosinophils Absolute: 0 K/uL (ref 0.0–0.5)
Eosinophils Relative: 2 %
HCT: 36.1 % — ABNORMAL LOW (ref 39.0–52.0)
Hemoglobin: 12.3 g/dL — ABNORMAL LOW (ref 13.0–17.0)
Immature Granulocytes: 0 %
Lymphocytes Relative: 32 %
Lymphs Abs: 0.6 K/uL — ABNORMAL LOW (ref 0.7–4.0)
MCH: 31.5 pg (ref 26.0–34.0)
MCHC: 34.1 g/dL (ref 30.0–36.0)
MCV: 92.3 fL (ref 80.0–100.0)
Monocytes Absolute: 0.3 K/uL (ref 0.1–1.0)
Monocytes Relative: 14 %
Neutro Abs: 0.9 K/uL — ABNORMAL LOW (ref 1.7–7.7)
Neutrophils Relative %: 51 %
Platelet Count: 197 K/uL (ref 150–400)
RBC: 3.91 MIL/uL — ABNORMAL LOW (ref 4.22–5.81)
RDW: 14.9 % (ref 11.5–15.5)
WBC Count: 1.8 K/uL — ABNORMAL LOW (ref 4.0–10.5)
nRBC: 0 % (ref 0.0–0.2)

## 2024-06-18 LAB — CMP (CANCER CENTER ONLY)
ALT: 21 U/L (ref 0–44)
AST: 19 U/L (ref 15–41)
Albumin: 4.2 g/dL (ref 3.5–5.0)
Alkaline Phosphatase: 59 U/L (ref 38–126)
Anion gap: 9 (ref 5–15)
BUN: 15 mg/dL (ref 6–20)
CO2: 26 mmol/L (ref 22–32)
Calcium: 9.5 mg/dL (ref 8.9–10.3)
Chloride: 106 mmol/L (ref 98–111)
Creatinine: 0.89 mg/dL (ref 0.61–1.24)
GFR, Estimated: >60 mL/min
Glucose, Bld: 98 mg/dL (ref 70–99)
Potassium: 4.5 mmol/L (ref 3.5–5.1)
Sodium: 140 mmol/L (ref 135–145)
Total Bilirubin: 0.5 mg/dL (ref 0.0–1.2)
Total Protein: 6.5 g/dL (ref 6.5–8.1)

## 2024-06-18 LAB — PSA: Prostatic Specific Antigen: <0.02 ng/mL (ref 0.00–4.00)

## 2024-06-18 MED ORDER — RELUGOLIX 120 MG PO TABS: ORAL_TABLET | ORAL | 0 refills | Status: DC

## 2024-06-18 MED ORDER — RELUGOLIX 120 MG PO TABS: 120.0000 mg | ORAL_TABLET | Freq: Every day | ORAL | 11 refills | Status: DC

## 2024-06-18 NOTE — Telephone Encounter (Addendum)
 Oral Oncology Patient Advocate Encounter  Prior Authorization for Orgovyx  has been approved.    PA# [id number] Effective dates: 06/18/24 through 12/15/24  Patients co-pay is $2,712.95.  Getting copay savings card for him.   Lucie Lamer, CPhT   [facility] Oncology Pharmacy Patient Advocate Specialist [name] Phone: [phone]  Fax: [phone] [email or web address]

## 2024-06-18 NOTE — Telephone Encounter (Signed)
 Oral Oncology Pharmacist Encounter  Due to patients cost of the medication and that the copay card will only cover 3 months fully, it is recommend that we continue with Lupron versus changing to the Orgovyx . MD notified and agrees with plan. Will discontinue Orgovyx  at this time.   Lincy Belles, PharmD Hematology/Oncology Clinical Pharmacist [facility] [phone]

## 2024-06-18 NOTE — Telephone Encounter (Signed)
 Oral Oncology Patient Advocate Encounter   Received notification that prior authorization for relugolix  (ORGOVYX ) 120 MG tablet  is required.   PA submitted on 06/18/24 Key [id number] Status is pending     Lucie Lamer, CPhT [place]  [facility] Specialty Pharm[facility] Services Oncology Pharmacy Patient Advocate Specialist [facility] [name] Phone: [phone]  Fax: [phone] [email or web address]

## 2024-06-18 NOTE — Telephone Encounter (Signed)
 Oral Oncology Patient Advocate Encounter   Was successful in obtaining a copay card for Orgovyx .  This copay card will make the patients copay $10.  The copay card has an annual maximum benefit of $10,000   The billing information is as follows and has been shared with [facility].   RxBin: [id number] PCN:  Member ID: [id number] Group ID: [id number]   Lucie Lamer, CPhT [place]  [facility] Oncology Pharmacy Patient Advocate Specialist [facility] [name] Phone: [phone]  Fax: [phone] [email or web address]

## 2024-06-18 NOTE — Telephone Encounter (Signed)
 Spoke with the patient made his aware of his medication changes. He VU of this. An appointment will be made for his next Lupron. Arland Legions BSN RN

## 2024-06-18 NOTE — Assessment & Plan Note (Addendum)
 Moderate neutropenia. No signs of infection. Repeat labs next month.

## 2024-06-18 NOTE — Addendum Note (Signed)
 Addended by: TINA HUDSON on: 06/18/2024 03:43 PM   Modules accepted: Orders

## 2024-06-18 NOTE — Telephone Encounter (Signed)
 Oral Oncology Pharmacist Encounter  Received new prescription for Orgovyx  (relugolix ) for the treatment of metastatic hormone sensitive prostate cancer with high volume disease in conjunction with nubeqa  (docetaxel  6 cycles have been completed), planned duration until disease progression or unacceptable toxicity. ADT (Lupron) changing to Orgovyx .   Labs from 06/19/2023 (CBC and CMP) assessed, no interventions needed. Prescription dose and frequency assessed for appropriateness.   Current medication list in Epic reviewed, no significant/ relevant DDIs with Orgovyx  identified.   Evaluated chart and no patient barriers to medication adherence noted.   Patient agreement for treatment documented in MD note on 06/18/2024.  Prescription has been e-scribed to the [facility] for benefits analysis and approval.  Oral Oncology Clinic will continue to follow for insurance authorization, copayment issues, initial counseling and start date.  Blake Maxwell, PharmD Hematology/Oncology Clinical Pharmacist [facility] Oral Chemotherapy Navigation Clinic [phone] 06/18/2024 10:28 AM

## 2024-06-19 ENCOUNTER — Other Ambulatory Visit: Payer: Self-pay

## 2024-06-19 LAB — TESTOSTERONE: Testosterone: 23 ng/dL — ABNORMAL LOW (ref 264–916)

## 2024-06-25 ENCOUNTER — Other Ambulatory Visit (HOSPITAL_COMMUNITY): Payer: Self-pay

## 2024-06-25 ENCOUNTER — Telehealth: Payer: Self-pay

## 2024-06-25 ENCOUNTER — Other Ambulatory Visit: Payer: Self-pay

## 2024-06-25 DIAGNOSIS — C61 Malignant neoplasm of prostate: Secondary | ICD-10-CM

## 2024-06-25 MED ORDER — NUBEQA 300 MG PO TABS: 600.0000 mg | ORAL_TABLET | Freq: Two times a day (BID) | ORAL | 11 refills | Status: AC

## 2024-06-25 NOTE — Telephone Encounter (Signed)
 Oral Oncology Patient Advocate Encounter   Was successful in obtaining a copay card for Nubeqa .  This copay card will make the patients copay $0.  The copay card has an annual maximum benefit of $25000   The billing information is as follows and has been shared with [facility].   RxBin: [id number] PCN: [id number] Member ID: [id number] Group ID: [id number]   Lucie Lamer, CPhT [place]  [facility] Oncology Pharmacy Patient Advocate Specialist [facility] [name] Phone: [phone]  Fax: [phone] [email or web address]

## 2024-06-27 ENCOUNTER — Telehealth: Payer: Self-pay

## 2024-06-27 NOTE — Telephone Encounter (Signed)
 Scheduled patient for lupron injection. Called and spoke with the patient, he is aware of day and time.

## 2024-07-01 ENCOUNTER — Other Ambulatory Visit: Payer: Self-pay

## 2024-07-02 ENCOUNTER — Inpatient Hospital Stay

## 2024-07-02 VITALS — BP 120/57 | HR 51 | Temp 98.6°F | Resp 20

## 2024-07-02 DIAGNOSIS — C61 Malignant neoplasm of prostate: Secondary | ICD-10-CM

## 2024-07-02 DIAGNOSIS — Z5111 Encounter for antineoplastic chemotherapy: Secondary | ICD-10-CM | POA: Diagnosis not present

## 2024-07-02 MED ORDER — LEUPROLIDE ACETATE (3 MONTH) 22.5 MG IM KIT
22.5000 mg | PACK | Freq: Once | INTRAMUSCULAR | Status: AC
  Administered 2024-07-02: 22.5 mg via INTRAMUSCULAR
  Filled 2024-07-02: qty 22.5

## 2024-07-13 ENCOUNTER — Telehealth: Payer: Self-pay

## 2024-07-13 ENCOUNTER — Inpatient Hospital Stay

## 2024-07-13 DIAGNOSIS — C61 Malignant neoplasm of prostate: Secondary | ICD-10-CM

## 2024-07-13 DIAGNOSIS — Z5111 Encounter for antineoplastic chemotherapy: Secondary | ICD-10-CM | POA: Diagnosis not present

## 2024-07-13 LAB — CBC WITH DIFFERENTIAL (CANCER CENTER ONLY)
Abs Immature Granulocytes: 0 10*3/uL (ref 0.00–0.07)
Basophils Absolute: 0 10*3/uL (ref 0.0–0.1)
Basophils Relative: 1 %
Eosinophils Absolute: 0 10*3/uL (ref 0.0–0.5)
Eosinophils Relative: 2 %
HCT: 35 % — ABNORMAL LOW (ref 39.0–52.0)
Hemoglobin: 12 g/dL — ABNORMAL LOW (ref 13.0–17.0)
Immature Granulocytes: 0 %
Lymphocytes Relative: 29 %
Lymphs Abs: 0.6 10*3/uL — ABNORMAL LOW (ref 0.7–4.0)
MCH: 31.6 pg (ref 26.0–34.0)
MCHC: 34.3 g/dL (ref 30.0–36.0)
MCV: 92.1 fL (ref 80.0–100.0)
Monocytes Absolute: 0.3 10*3/uL (ref 0.1–1.0)
Monocytes Relative: 14 %
Neutro Abs: 1.2 10*3/uL — ABNORMAL LOW (ref 1.7–7.7)
Neutrophils Relative %: 54 %
Platelet Count: 145 10*3/uL — ABNORMAL LOW (ref 150–400)
RBC: 3.8 MIL/uL — ABNORMAL LOW (ref 4.22–5.81)
RDW: 13.8 % (ref 11.5–15.5)
WBC Count: 2.2 10*3/uL — ABNORMAL LOW (ref 4.0–10.5)
nRBC: 0 % (ref 0.0–0.2)

## 2024-07-13 LAB — CMP (CANCER CENTER ONLY)
ALT: 24 U/L (ref 0–44)
AST: 19 U/L (ref 15–41)
Albumin: 4.2 g/dL (ref 3.5–5.0)
Alkaline Phosphatase: 74 U/L (ref 38–126)
Anion gap: 10 (ref 5–15)
BUN: 16 mg/dL (ref 6–20)
CO2: 24 mmol/L (ref 22–32)
Calcium: 9.1 mg/dL (ref 8.9–10.3)
Chloride: 106 mmol/L (ref 98–111)
Creatinine: 0.9 mg/dL (ref 0.61–1.24)
GFR, Estimated: >60 mL/min
Glucose, Bld: 95 mg/dL (ref 70–99)
Potassium: 4.3 mmol/L (ref 3.5–5.1)
Sodium: 140 mmol/L (ref 135–145)
Total Bilirubin: 0.3 mg/dL (ref 0.0–1.2)
Total Protein: 6.5 g/dL (ref 6.5–8.1)

## 2024-07-13 LAB — PSA: Prostatic Specific Antigen: <0.02 ng/mL (ref 0.00–4.00)

## 2024-07-13 NOTE — Telephone Encounter (Signed)
 Left a vm to schedule labs today and set up phone visit on Monday at 4:30.

## 2024-07-14 LAB — TESTOSTERONE: Testosterone: 13 ng/dL — ABNORMAL LOW (ref 264–916)

## 2024-07-16 ENCOUNTER — Inpatient Hospital Stay

## 2024-07-16 NOTE — Assessment & Plan Note (Addendum)
 Moderate neutropenia. No signs of infection. Repeat labs next month. Will check ANA reflex

## 2024-07-16 NOTE — Assessment & Plan Note (Addendum)
 calcium (1000-1200 mg daily from food and supplements) and vitamin D3 (1000 IU daily) Routine dental care Healthy lifestyle to prevent diabetes and CV disease Weight-bearing exercises (30 minutes per day) Limit alcohol consumption and avoid smoking May use ginger ale, ginger tea for nausea prevention.

## 2024-07-16 NOTE — Progress Notes (Signed)
 [facility] OFFICE PROGRESS NOTE  Patient Care Team: [name], NP as PCP - General (Nurse Practitioner) Vertell Pont, RN as Oncology Nurse Navigator  Telephone visit due to weather Physician location: home in [place]. Patient location: Home Total time for the encounter:  Domenico is a 54 y.o.male with history of prostate cancer, CKD being seen at Medical Oncology Clinic for prostate cancer. Initially with GS 4+5=9 GG5 in 2019 enrolled into PROTEUS trial with 6 months of neoadjuvant ADT +/- apalutamide followed by RP with BPLND. Final pathology showed ypT3bN1Mx GS 4+5=9 w/focal +margin 1/13 LN positive.  He had adjuvant fossa and pelvic node radiation in 05/2020 and SBRT to T7 and L2 oligometastatic lesions in 2023.    Now completed 6 cycles of docetaxel .   PSA up to 19.5 and PSMA showed multiple bone metastases in 05/2023.  Patient was started on apalutamide with ADT in January 2025 and developed neutropenia.  He was switched to darolutamide , and initiated triplet therapy given high-volume disease.   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  Some neuropathy but getting better.  No signs of infection.  Will continue Lupron .  Continue darolutamide . Slightly neutropenia and thrombocytopenia. Repeat PSMA PET for staging and treat any oligometastases as able.  Return on 3/16 at 4. Lab on 3/9 with PET scan Assessment & Plan Malignant neoplasm of prostate (HCC) Completed cycle 6 Docetaxel  in Dec. Insurance declined orgovyx . Continue Lupron  (1/19 at 3 mo dose) as ADT, and darolutamide .  HOXB13 mutations.  Labs with PET on 3/9 and follow up on 3/16 at 4 with me Other neutropenia Moderate neutropenia. No signs of infection. Repeat labs next month. Will check ANA reflex At risk  for side effect of medication calcium (1000-1200 mg daily from food and supplements) and vitamin D3 (1000 IU daily) Routine dental care Healthy lifestyle to prevent diabetes and CV disease Weight-bearing exercises (30 minutes per day) Limit alcohol consumption and avoid smoking May use ginger ale, ginger tea for nausea prevention. Neuropathy intermittent.  Improved on movement  Orders Placed This Encounter  Procedures   NM PET (PSMA) SKULL TO MID THIGH    Standing Status:   Future    Expected Date:   08/20/2024    Expiration Date:   07/16/2025    If indicated for the ordered procedure, I authorize the administration of a radiopharmaceutical per Radiology protocol:   Yes    Preferred imaging location?:   [place]   CBC with Differential (Cancer Center Only)    Standing Status:   Future    Expiration Date:   07/16/2025   CMP (Cancer Center only)    Standing Status:   Future    Expiration Date:   07/16/2025   Testosterone     Standing Status:   Future    Expiration Date:   07/16/2025   PSA    Standing Status:   Future    Expiration Date:   07/16/2025   Folate    Standing Status:   Future    Expiration Date:   07/16/2025   ANA, IFA (with reflex)    Standing Status:   Future    Expiration Date:   07/16/2025     [name], MD  INTERVAL HISTORY: Patient being follow-up on phone visit due to weather. Overall feeling well.  No new chest  pain, bone or back or hip pain. Neuropathy intermittently improved with movement.  Taking daro as prescribed. No missing doses.  Oncology History  Malignant neoplasm of prostate (HCC)  05/09/2018 Initial Diagnosis   Malignant neoplasm of prostate (HCC)  Biopsy on 03/28/18 Highest GS 4+5=9 GG5 in 1 cores. 4+3 in 7 cores. Rest 3+4. All cores + prostate cancer.    12/25/2018 Cancer Staging   Enrolled into PROTEUS trial with 6 months of neoadjuvant ADT +/- apalutamide.  Pretreatment PSA 56.3. 12/25/18 RP w/BPLND. ypT3bN1Mx GS 4+5=9 w/focal +margin  1/13 LN positive  Completed 6 months of adjuvant ADT +/- apa. Staging form: Prostate, AJCC 8th Edition - Pathologic stage from 12/25/2018: Stage IVA (pT3b, pN1, cM0, PSA: 56, Grade Group: 5) - Signed by Sherwood Rise, PA-C on 04/01/2020   04/2020 - 06/2020 Radiation Therapy   Adjuvant radiation (Dr. Patrcia)   03/2021 Progression   BCR   07/2021 PET scan   PSMA PET ADDENDUM: In retrospect and comparison to PSMA PET scan of 01/31/2022, there is a radiotracer avid lesion in the L2 transverse process (RIGHT) with SUV max equal 11.7.   07/2021 PET scan   PSMA PET 1. New radiotracer avid skeletal metastasis in the T7 Vertebral body 2. Increased radiotracer activity at L2 transverse process skeletal metastasis. 3. No metastatic adenopathy in the pelvis periaortic retroperitoneum. 4. No visceral metastasis   07/20/2021 Tumor Marker   Psa 0.81   01/18/2022 Tumor Marker   PSA 1.83   03/08/2022 - 03/18/2022 Radiation Therapy   RT to oligometastatic lesions on T7 and L2   04/12/2022 Tumor Marker   PSA 1.26   07/06/2022 Tumor Marker   PSA 0.28   01/18/2023 Tumor Marker   PSA 12   03/21/2023 Tumor Marker   PSA 19.5   05/2023 Progression   PET noted progression of bone metastases.  1. Progression of skeletal radiotracer avid prostate cancer metastatic disease. New lesions in the thoracic spine, ribs and pelvis. Approximately 8-10 radiotracer avid lesions. 2. No evidence of metastatic adenopathy or visceral metastasis   06/2023 -  Chemotherapy   Started ADT with apa Developed neutropenia.   06/28/2023 Tumor Marker   PSA 57.2   09/27/2023 Tumor Marker   PSA 10.1 T 24.7   01/09/2024 - 05/2024 Chemotherapy   Patient is on Treatment Plan : PROSTATE Docetaxel  (75) + Prednisone  q21d      03/29/2024 Cancer Staging   Staging form: Prostate, AJCC 8th Edition - Pathologic: Stage IVB (rpN0, rcM1b, Grade Group: 5) - Signed by Tina Pauletta BROCKS, MD on 03/29/2024 Stage prefix:  Recurrence Histologic grading system: 5 grade system    Relevant data reviewed during this visit included labs and imaging. New labs and imaging ordered.
